# Patient Record
Sex: Female | Born: 1995 | Race: Black or African American | Hispanic: No | Marital: Single | Smoking: Current every day smoker
Health system: Southern US, Community
[De-identification: ages and names within clinical notes are randomized; demographics above are authoritative.]

---

## 2014-01-16 ENCOUNTER — Emergency Department (HOSPITAL_BASED_OUTPATIENT_CLINIC_OR_DEPARTMENT_OTHER): Payer: Self-pay

## 2014-01-16 ENCOUNTER — Encounter (HOSPITAL_BASED_OUTPATIENT_CLINIC_OR_DEPARTMENT_OTHER): Payer: Self-pay | Admitting: Emergency Medicine

## 2014-01-16 ENCOUNTER — Emergency Department (HOSPITAL_BASED_OUTPATIENT_CLINIC_OR_DEPARTMENT_OTHER)
Admission: EM | Admit: 2014-01-16 | Discharge: 2014-01-16 | Disposition: A | Discharge status: Home / Self Care | Payer: Self-pay | Attending: Emergency Medicine | Admitting: Emergency Medicine

## 2014-01-16 DIAGNOSIS — S93629A Sprain of tarsometatarsal ligament of unspecified foot, initial encounter: Secondary | ICD-10-CM | POA: Insufficient documentation

## 2014-01-16 DIAGNOSIS — X500XXA Overexertion from strenuous movement or load, initial encounter: Secondary | ICD-10-CM | POA: Insufficient documentation

## 2014-01-16 DIAGNOSIS — F17200 Nicotine dependence, unspecified, uncomplicated: Secondary | ICD-10-CM | POA: Insufficient documentation

## 2014-01-16 DIAGNOSIS — Y9302 Activity, running: Secondary | ICD-10-CM | POA: Insufficient documentation

## 2014-01-16 DIAGNOSIS — S93609A Unspecified sprain of unspecified foot, initial encounter: Secondary | ICD-10-CM

## 2014-01-16 DIAGNOSIS — Y9289 Other specified places as the place of occurrence of the external cause: Secondary | ICD-10-CM | POA: Insufficient documentation

## 2014-01-16 MED ORDER — NAPROXEN 500 MG PO TABS
500.0000 mg | ORAL_TABLET | Freq: Two times a day (BID) | ORAL | Status: DC
Start: 2014-01-16 — End: 2014-05-07

## 2014-01-16 NOTE — ED Notes (Signed)
Fell 1 month ago  Has started having left foot pain since getting worse

## 2014-01-16 NOTE — Discharge Instructions (Signed)
Foot Sprain The muscles and cord like structures which attach muscle to bone (tendons) that surround the feet are made up of units. A foot sprain can occur at the weakest spot in any of these units. This condition is most often caused by injury to or overuse of the foot, as from playing contact sports, or aggravating a previous injury, or from poor conditioning, or obesity. SYMPTOMS  Pain with movement of the foot.  Tenderness and swelling at the injury site.  Loss of strength is present in moderate or severe sprains. THE THREE GRADES OR SEVERITY OF FOOT SPRAIN ARE:  Mild (Grade I): Slightly pulled muscle without tearing of muscle or tendon fibers or loss of strength.  Moderate (Grade II): Tearing of fibers in a muscle, tendon, or at the attachment to bone, with small decrease in strength.  Severe (Grade III): Rupture of the muscle-tendon-bone attachment, with separation of fibers. Severe sprain requires surgical repair. Often repeating (chronic) sprains are caused by overuse. Sudden (acute) sprains are caused by direct injury or over-use. DIAGNOSIS  Diagnosis of this condition is usually by your own observation. If problems continue, a caregiver may be required for further evaluation and treatment. X-rays may be required to make sure there are not breaks in the bones (fractures) present. Continued problems may require physical therapy for treatment. PREVENTION  Use strength and conditioning exercises appropriate for your sport.  Warm up properly prior to working out.  Use athletic shoes that are made for the sport you are participating in.  Allow adequate time for healing. Early return to activities makes repeat injury more likely, and can lead to an unstable arthritic foot that can result in prolonged disability. Mild sprains generally heal in 3 to 10 days, with moderate and severe sprains taking 2 to 10 weeks. Your caregiver can help you determine the proper time required for  healing. HOME CARE INSTRUCTIONS   Apply ice to the injury for 15-20 minutes, 03-04 times per day. Put the ice in a plastic bag and place a towel between the bag of ice and your skin.  An elastic wrap (like an Ace bandage) may be used to keep swelling down.  Keep foot above the level of the heart, or at least raised on a footstool, when swelling and pain are present.  Try to avoid use other than gentle range of motion while the foot is painful. Do not resume use until instructed by your caregiver. Then begin use gradually, not increasing use to the point of pain. If pain does develop, decrease use and continue the above measures, gradually increasing activities that do not cause discomfort, until you gradually achieve normal use.  Use crutches if and as instructed, and for the length of time instructed.  Keep injured foot and ankle wrapped between treatments.  Massage foot and ankle for comfort and to keep swelling down. Massage from the toes up towards the knee.  Only take over-the-counter or prescription medicines for pain, discomfort, or fever as directed by your caregiver. SEEK IMMEDIATE MEDICAL CARE IF:   Your pain and swelling increase, or pain is not controlled with medications.  You have loss of feeling in your foot or your foot turns cold or blue.  You develop new, unexplained symptoms, or an increase of the symptoms that brought you to your caregiver. MAKE SURE YOU:   Understand these instructions.  Will watch your condition.  Will get help right away if you are not doing well or get worse. Document Released:   01/29/2002 Document Revised: 11/01/2011 Document Reviewed: 03/28/2008 [employer] Patient Information 2014 [employer].  

## 2014-01-16 NOTE — ED Notes (Signed)
C/o injury to left foot/ankle approx 1 month ago-wearing velcro spint

## 2014-01-16 NOTE — ED Provider Notes (Addendum)
CSN: [id number]     Arrival date & time 01/16/14  2007 History   First MD Initiated Contact with Patient 01/16/14 2151     This chart was scribed for Gwyneth Sprout, MD by Arlan Organ, ED Scribe. This patient was seen in room MH05/MH05 and the patient's care was started 10:01 PM.   Chief Complaint  Patient presents with  . Foot Injury   The history is provided by the patient. No language interpreter was used.    HPI Comments: Rhonda House is a 18 y.o. female who presents to the Emergency Department complaining of a constant, moderate L foot pain x 1 month that has progressively worsened in the last 5 days ago. [place] her foot in the water. She has tried OTC Ibuprofen with mild temporary improvement. She has also purchased a brace for her foot. She denies any fever, chills, numbness, paresthesia, loss of sensation, or weakness. Pt currently works at [employer] and states she is on her feet often. She has no pertinent past medical history. No other concerns this visit.  History reviewed. No pertinent past medical history. History reviewed. No pertinent past surgical history. No family history on file. History  Substance Use Topics  . Smoking status: Current Every Day Smoker  . Smokeless tobacco: Not on file  . Alcohol Use: No   OB History   Grav Para Term Preterm Abortions TAB SAB Ect Mult Living                 Review of Systems  A complete 10 system review of systems was obtained and all systems are negative except as noted in the HPI and PMH.    Allergies  Bactrim  Home Medications   Prior to Admission medications   Not on File   Triage Vitals: BP 169/92  Pulse 98  Temp(Src) 98.7 F (37.1 C) (Oral)  Resp 18  Ht 5\' 7"  (1.702 m)  Wt 210 lb (95.255 kg)  BMI 32.88 kg/m2  SpO2 99%  LMP 01/13/2014   Physical Exam  Nursing note and vitals reviewed. Constitutional: She is oriented to person, place, and  time. She appears well-developed and well-nourished.  HENT:  Head: Normocephalic.  Eyes: EOM are normal.  Neck: Normal range of motion.  Pulmonary/Chest: Effort normal.  Abdominal: She exhibits no distension.  Musculoskeletal: Normal range of motion. She exhibits tenderness.  Pain over L proximal first metatarsal and talar No distal fibular pain Pain with foot extension No medial or lateral malleolar tenderness  Neurological: She is alert and oriented to person, place, and time.  Psychiatric: She has a normal mood and affect.    ED Course  Procedures (including critical care time)  DIAGNOSTIC STUDIES: Oxygen Saturation is 99% on RA, Normal by my interpretation.    COORDINATION OF CARE: 10:04 PM- Will order X-Rays. Discussed treatment plan with pt at bedside and pt agreed to plan.     Labs Review Labs Reviewed - No data to display  Imaging Review Dg Ankle Complete Left  01/16/2014   CLINICAL DATA:  Injury to the left foot, with left ankle pain.  EXAM: LEFT ANKLE COMPLETE - 3+ VIEW  COMPARISON:  None.  FINDINGS: There is no evidence of fracture or dislocation. The ankle mortise is intact; the interosseous space is within normal limits. No talar tilt or subluxation is seen. A small osseous fragment distal to the distal fibula is thought  to reflect an os subfibulare.  The joint spaces are preserved. No significant soft tissue abnormalities are seen.  IMPRESSION: 1. No definite evidence of fracture or dislocation. 2. Apparent loss subfibulare noted; if the patient has symptoms at the distal fibula, this could reflect a small avulsion fracture.   Electronically Signed   By: Roanna RaiderJeffery  Chang M.D.   On: 01/16/2014 21:39   Dg Foot Complete Left  01/16/2014   CLINICAL DATA:  Larey SeatFell 1 month ago, foot and ankle pain progressively worse, foot injury  EXAM: LEFT FOOT - COMPLETE 3+ VIEW  COMPARISON:  None.  FINDINGS: Osseous mineralization normal.  Joint spaces preserved.  No fracture, dislocation, or  bone destruction.  IMPRESSION: Normal exam.   Electronically Signed   By: Ulyses SouthwardMark  Boles M.D.   On: 01/16/2014 21:38     EKG Interpretation None      MDM   Final diagnoses:  Foot sprain    Patient with foot injury that worsened after wearing heels. She had the initial injury approximately one month ago and has been sore. She has pain Over the medial dorsal surface of the left foot that is worse with flexion and extension. Neurovascularly intact and imaging negative. She has no distal fibula pain. He'll most likely foot sprain versus ligamentous injury. She was treated with anti-inflammatories, ice and elevation. She will followup with sports medicine if symptoms are not improving  I personally performed the services described in this documentation, which was scribed in my presence. The recorded information has been reviewed and is accurate.    [name], MD 01/16/14 2240  [name], MD 01/16/14 2250

## 2014-05-07 ENCOUNTER — Encounter (HOSPITAL_BASED_OUTPATIENT_CLINIC_OR_DEPARTMENT_OTHER): Payer: Self-pay | Admitting: Emergency Medicine

## 2014-05-07 ENCOUNTER — Emergency Department (HOSPITAL_BASED_OUTPATIENT_CLINIC_OR_DEPARTMENT_OTHER): Payer: Self-pay

## 2014-05-07 ENCOUNTER — Emergency Department (HOSPITAL_BASED_OUTPATIENT_CLINIC_OR_DEPARTMENT_OTHER)
Admission: EM | Admit: 2014-05-07 | Discharge: 2014-05-07 | Disposition: A | Discharge status: Home / Self Care | Payer: Self-pay | Attending: Emergency Medicine | Admitting: Emergency Medicine

## 2014-05-07 DIAGNOSIS — Z3202 Encounter for pregnancy test, result negative: Secondary | ICD-10-CM | POA: Insufficient documentation

## 2014-05-07 DIAGNOSIS — F17200 Nicotine dependence, unspecified, uncomplicated: Secondary | ICD-10-CM | POA: Insufficient documentation

## 2014-05-07 DIAGNOSIS — B9789 Other viral agents as the cause of diseases classified elsewhere: Secondary | ICD-10-CM

## 2014-05-07 DIAGNOSIS — J069 Acute upper respiratory infection, unspecified: Secondary | ICD-10-CM | POA: Insufficient documentation

## 2014-05-07 DIAGNOSIS — R05 Cough: Secondary | ICD-10-CM | POA: Insufficient documentation

## 2014-05-07 DIAGNOSIS — R059 Cough, unspecified: Secondary | ICD-10-CM | POA: Insufficient documentation

## 2014-05-07 LAB — URINALYSIS, ROUTINE W REFLEX MICROSCOPIC
Bilirubin Urine: NEGATIVE
Glucose, UA: NEGATIVE mg/dL
Hgb urine dipstick: NEGATIVE
KETONES UR: NEGATIVE mg/dL
LEUKOCYTES UA: NEGATIVE
NITRITE: NEGATIVE
PROTEIN: NEGATIVE mg/dL
Specific Gravity, Urine: 1.009 (ref 1.005–1.030)
Urobilinogen, UA: 0.2 mg/dL (ref 0.0–1.0)
pH: 6.5 (ref 5.0–8.0)

## 2014-05-07 LAB — PREGNANCY, URINE: Preg Test, Ur: NEGATIVE

## 2014-05-07 LAB — RAPID STREP SCREEN (MED CTR MEBANE ONLY): Streptococcus, Group A Screen (Direct): NEGATIVE

## 2014-05-07 MED ORDER — ALBUTEROL SULFATE HFA 108 (90 BASE) MCG/ACT IN AERS
2.0000 | INHALATION_SPRAY | RESPIRATORY_TRACT | Status: DC | PRN
  Administered 2014-05-07: 2 via RESPIRATORY_TRACT
  Filled 2014-05-07: qty 6.7

## 2014-05-07 MED ORDER — OXYMETAZOLINE HCL 0.05 % NA SOLN
1.0000 | Freq: Once | NASAL | Status: AC
  Administered 2014-05-07: 1 via NASAL
  Filled 2014-05-07: qty 15

## 2014-05-07 NOTE — ED Provider Notes (Signed)
CSN: [id number]     Arrival date & time 05/07/14  1956 History  This chart was scribed for Ethelda Chick, MD by Roxy Cedar, ED Scribe. This patient was seen in room MH11/MH11 and the patient's care was started at 10:29 PM.   Chief Complaint  Patient presents with  . Cough   Patient is a 18 y.o. female presenting with pharyngitis and cough. The history is provided by the patient. No language interpreter was used.  Sore Throat This is a new problem. The current episode started more than 2 days ago. The problem occurs constantly. The problem has not changed since onset.Pertinent negatives include no chest pain, no abdominal pain, no headaches and no shortness of breath. Nothing aggravates the symptoms. Nothing relieves the symptoms. She has tried nothing for the symptoms.  Cough Severity:  Moderate Onset quality:  Gradual Duration:  3 days Timing:  Intermittent Progression:  Unchanged Chronicity:  New Associated symptoms: chills and sore throat   Associated symptoms: no chest pain, no headaches and no shortness of breath     HPI Comments: Rhonda House is a 18 y.o. female who presents to the Emergency Department complaining of chills, sore throat, cough that began 3 days ago. Patient assumed it was related to seasonal allergies and has tried taking Claritin, Theraflu, and Robitussin with no relief. Patient states she developed a blister in her mouth in the past 2 days.   History reviewed. No pertinent past medical history. History reviewed. No pertinent past surgical history. No family history on file. History  Substance Use Topics  . Smoking status: Current Every Day Smoker  . Smokeless tobacco: Not on file  . Alcohol Use: No   OB History   Grav Para Term Preterm Abortions TAB SAB Ect Mult Living                 Review of Systems  Constitutional: Positive for chills.  HENT: Positive for sore throat.   Respiratory: Positive for cough. Negative for shortness of breath.    Cardiovascular: Negative for chest pain.  Gastrointestinal: Negative for abdominal pain.  Neurological: Negative for headaches.  All other systems reviewed and are negative.  Allergies  Bactrim  Home Medications   Prior to Admission medications   Not on File   Triage Vitals: BP 133/64  Pulse 82  Temp(Src) 98.9 F (37.2 C) (Oral)  Resp 18  Ht  (1.727 m)  Wt 210 lb (95.255 kg)  BMI 31.94 kg/m2  SpO2 100%  LMP 04/21/2014  Physical Exam  Nursing note and vitals reviewed. Constitutional: She is oriented to person, place, and time. She appears well-developed and well-nourished. No distress.  HENT:  Head: Normocephalic and atraumatic.  Right Ear: External ear normal.  Left Ear: External ear normal.  Mouth/Throat: Oropharynx is clear and moist. No oropharyngeal exudate.  Copious nasal congestion. Mild erythema of oropharynx. Palate is symmetric. Uvula is midline.  Eyes: Conjunctivae and EOM are normal.  Neck: Neck supple. No tracheal deviation present.  Cardiovascular: Normal rate.   Pulmonary/Chest: Effort normal and breath sounds normal. No respiratory distress. She has no wheezes.  Lungs are clear. No wheezing or rhonchi noted.    Musculoskeletal: Normal range of motion.  Neurological: She is alert and oriented to person, place, and time.  Skin: Skin is warm and dry.  Psychiatric: She has a normal mood and affect. Her behavior is normal.    ED Course  Procedures (including critical care time)  DIAGNOSTIC STUDIES: Oxygen  Saturation is 100% on RA, normal by my interpretation.    COORDINATION OF CARE: 10:35 PM- Discussed plans to give patient an inhaler and Pt advised of plan for treatment and pt agrees.  Labs Review Labs Reviewed  RAPID STREP SCREEN  CULTURE, GROUP A STREP  URINALYSIS, ROUTINE W REFLEX MICROSCOPIC  PREGNANCY, URINE    Imaging Review Dg Chest 2 View  05/07/2014   CLINICAL DATA:  Cough and sore throat.  Chest pain.  EXAM: CHEST  2 VIEW   COMPARISON:  None.  FINDINGS: Normal heart size. Clear lungs. No pneumothorax. No pleural effusion.  IMPRESSION: No active cardiopulmonary disease.   Electronically Signed   By: Maryclare Bean M.D.   On: 05/07/2014 21:31     EKG Interpretation None     MDM   Final diagnoses:  Viral URI with cough    Pt presenting with cough, nasal congestion sore throat for the past several days.  Rapid strep negative, cxr reassuring.  Pt treated with afrin and given albuterol inhaler to help with symptomatic care.  Throat culture pending.  Suspect viral URI given constellation of symptoms.  Discharged with strict return precautions.  Pt agreeable with plan.  Nursing notes including past medical history and social history reviewed and considered in documentation   Xray images reviewed and interpreted by me as well.    I personally performed the services described in this documentation, which was scribed in my presence. The recorded information has been reviewed and is accurate.  Ethelda Chick, MD 05/07/14 [phone]

## 2014-05-07 NOTE — Discharge Instructions (Signed)
Return to the ED with any concerns including difficulty breathing despite using albuterol every 4 hours, not drinking fluids, decreased urine output, vomiting and not able to keep down liquids or medications, decreased level of alertness/lethargy, or any other alarming symptoms  You should use the afrin 2 sprays in each nostril twice daily, do not use for more than 3 days as this may cause worsening of your nasal congestion

## 2014-05-07 NOTE — Patient Instructions (Signed)
Instructed patient on the proper use of administering albuterol mdi via aerochamber patient tolerated well 

## 2014-05-07 NOTE — ED Notes (Signed)
Onset x 3 days   sorethroat  Cough  Blister in mouth

## 2014-05-08 ENCOUNTER — Encounter (HOSPITAL_COMMUNITY): Payer: Self-pay | Admitting: Emergency Medicine

## 2014-05-08 ENCOUNTER — Emergency Department (HOSPITAL_COMMUNITY)
Admission: EM | Admit: 2014-05-08 | Discharge: 2014-05-08 | Disposition: A | Discharge status: Home / Self Care | Payer: Self-pay | Attending: Emergency Medicine | Admitting: Emergency Medicine

## 2014-05-08 DIAGNOSIS — IMO0001 Reserved for inherently not codable concepts without codable children: Secondary | ICD-10-CM | POA: Insufficient documentation

## 2014-05-08 DIAGNOSIS — J029 Acute pharyngitis, unspecified: Secondary | ICD-10-CM | POA: Insufficient documentation

## 2014-05-08 DIAGNOSIS — H5789 Other specified disorders of eye and adnexa: Secondary | ICD-10-CM | POA: Insufficient documentation

## 2014-05-08 DIAGNOSIS — H6993 Unspecified Eustachian tube disorder, bilateral: Secondary | ICD-10-CM

## 2014-05-08 DIAGNOSIS — K120 Recurrent oral aphthae: Secondary | ICD-10-CM

## 2014-05-08 DIAGNOSIS — F17200 Nicotine dependence, unspecified, uncomplicated: Secondary | ICD-10-CM | POA: Insufficient documentation

## 2014-05-08 DIAGNOSIS — J22 Unspecified acute lower respiratory infection: Secondary | ICD-10-CM

## 2014-05-08 DIAGNOSIS — J3489 Other specified disorders of nose and nasal sinuses: Secondary | ICD-10-CM

## 2014-05-08 DIAGNOSIS — H6990 Unspecified Eustachian tube disorder, unspecified ear: Secondary | ICD-10-CM | POA: Insufficient documentation

## 2014-05-08 DIAGNOSIS — J069 Acute upper respiratory infection, unspecified: Secondary | ICD-10-CM

## 2014-05-08 DIAGNOSIS — H6980 Other specified disorders of Eustachian tube, unspecified ear: Secondary | ICD-10-CM | POA: Insufficient documentation

## 2014-05-08 MED ORDER — GUAIFENESIN ER 600 MG PO TB12: 1200.0000 mg | ORAL_TABLET | Freq: Two times a day (BID) | ORAL | Status: DC

## 2014-05-08 MED ORDER — HYDROCODONE-HOMATROPINE 5-1.5 MG/5ML PO SYRP: 5.0000 mL | ORAL_SOLUTION | Freq: Four times a day (QID) | ORAL | Status: DC | PRN

## 2014-05-08 NOTE — ED Provider Notes (Signed)
CSN: [id number]     Arrival date & time 05/08/14  1212 History  This chart was scribed for non-physician practitioner, Arthor Captain, PA-C working with Flint Melter, MD by Greggory Stallion, ED scribe. This patient was seen in room WTR8/WTR8 and the patient's care was started at 1:28 PM.   Chief Complaint  Patient presents with  . Sore Throat   The history is provided by the patient. No language interpreter was used.   HPI Comments: Rhonda House is a 18 y.o. female who presents to the Emergency Department complaining of gradual onset sore throat that started 5 days ago and worsened 3 days ago. States she woke up with nasal congestion, sinus pressure, chest congestion and nonproductive cough 2 days ago. She was evaluated at an urgent care yesterday where chest xray and rapid strep were done. Results were negative so she was discharged home with a nasal spray and an inhaler. States medications have provided little relief. Pt has taken Claritin, Theraflu and OTC cough syrup with no relief. She woke up this morning with bilateral sharp ear pain, generalized body aches and eye redness. Also reports a blister to the roof of her mouth. Pt's friend had a sinus infection right before her symptoms started. Denies fever.   History reviewed. No pertinent past medical history. History reviewed. No pertinent past surgical history. History reviewed. No pertinent family history. History  Substance Use Topics  . Smoking status: Current Every Day Smoker  . Smokeless tobacco: Not on file  . Alcohol Use: No   OB History   Grav Para Term Preterm Abortions TAB SAB Ect Mult Living                 Review of Systems  Constitutional: Negative for fever.  HENT: Positive for congestion, ear pain, mouth sores, sinus pressure and sore throat.   Eyes: Positive for redness.  Respiratory: Positive for cough.   Cardiovascular: Negative for chest pain.  Gastrointestinal: Negative for abdominal distention.   Musculoskeletal: Positive for myalgias.  Skin: Negative for rash.  Neurological: Negative for speech difficulty.  Psychiatric/Behavioral: Negative for confusion.   Allergies  Bactrim  Home Medications   Prior to Admission medications   Medication Sig Start Date End Date Taking? Authorizing Provider  guaiFENesin (MUCINEX) 600 MG 12 hr tablet Take 2 tablets (1,200 mg total) by mouth 2 (two) times daily. 05/08/14   Lavone Barrientes, PA-C  HYDROcodone-homatropine (HYCODAN) 5-1.5 MG/5ML syrup Take 5 mLs by mouth every 6 (six) hours as needed for cough. 05/08/14   Darroll Bredeson, PA-C   BP 130/60  Pulse 80  Temp(Src) 98.4 F (36.9 C) (Oral)  Resp 18  SpO2 100%  LMP 04/21/2014  Physical Exam  Nursing note and vitals reviewed. Constitutional: She is oriented to person, place, and time. She appears well-developed and well-nourished. No distress.  HENT:  Head: Normocephalic and atraumatic.  Right Ear: Tympanic membrane, external ear and ear canal normal.  Left Ear: Tympanic membrane, external ear and ear canal normal.  Nose: Mucosal edema and rhinorrhea present. No epistaxis. Right sinus exhibits no maxillary sinus tenderness and no frontal sinus tenderness. Left sinus exhibits no maxillary sinus tenderness and no frontal sinus tenderness.  Mouth/Throat: Uvula is midline and mucous membranes are normal. Mucous membranes are not pale and not cyanotic. No oropharyngeal exudate, posterior oropharyngeal edema, posterior oropharyngeal erythema or tonsillar abscesses.  Aphthous ulcer to roof of mouth.   Eyes: Conjunctivae are normal. Pupils are equal, round, and reactive to light.  Neck: Normal range of motion and full passive range of motion without pain.  Cardiovascular: Normal rate and regular rhythm.   Pulmonary/Chest: Effort normal and breath sounds normal. No stridor. No respiratory distress. She has no wheezes. She has no rhonchi. She has no rales.  Clear and equal breath sounds without  focal wheezes, rhonchi, rales  Abdominal: Soft. Bowel sounds are normal. There is no tenderness.  Musculoskeletal: Normal range of motion.  Lymphadenopathy:    She has cervical adenopathy.  Neurological: She is alert and oriented to person, place, and time.  Skin: Skin is warm and dry. No rash noted. She is not diaphoretic.  Psychiatric: She has a normal mood and affect.    ED Course  Procedures (including critical care time)  DIAGNOSTIC STUDIES: Oxygen Saturation is 100% on RA, normal by my interpretation.    COORDINATION OF CARE: 1:37 PM-Advised pt that sinus infections are not treated until 14 days of symptoms and that a CT does not need to be done. Discussed treatment plan which includes symptomatic treatment and high dose mucinex with pt at bedside and pt agreed to plan. If symptoms persist in 2 weeks, advised pt to follow up.   Labs Review Labs Reviewed - No data to display  Imaging Review No results found.   EKG Interpretation None      MDM   Final diagnoses:  Chest cold  URI (upper respiratory infection)  Sinus pressure  Eustachian tube disorder, bilateral  Aphthous ulcer of mouth     Patients symptoms are consistent with URI, likely viral etiology. Discussed that antibiotics are not indicated for viral infections. Pt will be discharged with symptomatic treatment.  Verbalizes understanding and is agreeable with plan. Pt is hemodynamically stable & in NAD prior to dc.  I personally performed the services described in this documentation, which was scribed in my presence. The recorded information has been reviewed and is accurate.  Arthor Captain, PA-C 05/09/14 2128

## 2014-05-08 NOTE — Progress Notes (Signed)
[facility] Community Liaison Stacy,  ° °Provided pt with a list of self-pay providers to help patient establish a pcp.  °

## 2014-05-08 NOTE — Discharge Instructions (Signed)
Read the instructions below on reasons to return to the emergency department and to learn more about your diagnosis.  Use over the counter medications for symptomatic relief as we discussed (musinex as a decongestant, Tylenol for fever/pain, Motrin/Ibuprofen for muscle aches). If prescribed a cough suppressant during your visit, do not operate heavy machinery with in 5 hours of taking this medication. Followup with your primary care doctor in 4 days if your symptoms persist.  Your more than welcome to return to the emergency department if symptoms worsen or become concerning.  Upper Respiratory Infection, Adult  An upper respiratory infection (URI) is also sometimes known as the common cold. Most people improve within 1 week, but symptoms can last up to 2 weeks. A residual cough may last even longer.   URI is most commonly caused by a virus. Viruses are NOT treated with antibiotics. You can easily spread the virus to others by oral contact. This includes kissing, sharing a glass, coughing, or sneezing. Touching your mouth or nose and then touching a surface, which is then touched by another person, can also spread the virus.   TREATMENT  Treatment is directed at relieving symptoms. There is no cure. Antibiotics are not effective, because the infection is caused by a virus, not by bacteria. Treatment may include:  Increased fluid intake. Sports drinks offer valuable electrolytes, sugars, and fluids.  Breathing heated mist or steam (vaporizer or shower).  Eating chicken soup or other clear broths, and maintaining good nutrition.  Getting plenty of rest.  Using gargles or lozenges for comfort.  Controlling fevers with ibuprofen or acetaminophen as directed by your caregiver.  Increasing usage of your inhaler if you have asthma.  Return to work when your temperature has returned to normal.   SEEK MEDICAL CARE IF:  After the first few days, you feel you are getting worse rather than better.  You  develop worsening shortness of breath, or brown or red sputum. These may be signs of pneumonia.  You develop yellow or brown nasal discharge or pain in the face, especially when you bend forward. These may be signs of sinusitis.  You develop a fever, swollen neck glands, pain with swallowing, or white areas in the back of your throat. These may be signs of strep throat.   Oral Ulcers Oral ulcers are painful, shallow sores around the lining of the mouth. They can affect the gums, the inside of the lips, and the cheeks. (Sores on the outside of the lips and on the face are different.) They typically first occur in school-aged children and teenagers. Oral ulcers may also be called canker sores or cold sores. CAUSES  Canker sores and cold sores can be caused by many factors including: Infection. Injury. Sun exposure. Medications. Emotional stress. Food allergies. Vitamin deficiencies. Toothpastes containing sodium lauryl sulfate. The herpes virus can be the cause of mouth ulcers. The first infection can be severe and cause 10 or more ulcers on the gums, tongue, and lips with fever and difficulty in swallowing. This infection usually occurs between the ages of 1 and 3 years.  SYMPTOMS  The typical sore is about  inch (6 mm) in size and is an oval or round ulcer with red borders. DIAGNOSIS  Your caregiver can diagnose simple oral ulcers by examination. Additional testing is usually not required.  TREATMENT  Treatment is aimed at pain relief. Generally, oral ulcers resolve by themselves within 1 to 2 weeks without medication and are not contagious unless caused by  herpes (and other viruses). Antibiotics are not effective with mouth sores. Avoid direct contact with others until the ulcer is completely healed. See your caregiver for follow-up care as recommended. Also: Offer a soft diet. Encourage plenty of fluids to prevent dehydration. Popsicles and milk shakes can be helpful. Avoid acidic and  salty foods and drinks such as orange juice. Infants and young children will often refuse to drink because of pain. Using a teaspoon, cup, or syringe to give small amounts of fluids frequently can help prevent dehydration. Cold compresses on the face may help reduce pain. Pain medication can help control soreness. A solution of diphenhydramine mixed with a liquid antacid can be useful to decrease the soreness of ulcers. Consult a caregiver for the dosing. Liquids or ointments with a numbing ingredient may be helpful when used as recommended. Older children and teenagers can rinse their mouth with a salt-water mixture (1/2 teaspoon of salt in 8 ounces of water) four times a day. This treatment is uncomfortable but may reduce the time the ulcers are present. There are many over-the-counter throat lozenges and medications available for oral ulcers. Their effectiveness has not been studied. Consult your medical caregiver prior to using homeopathic treatments for oral ulcers. SEEK MEDICAL CARE IF:  You think your child needs to be seen. The pain worsens and you cannot control it. There are 4 or more ulcers. The lips and gums begin to bleed and crust. A single mouth ulcer is near a tooth that is causing a toothache or pain. Your child has a fever, swollen face, or swollen glands. The ulcers began after starting a medication. Mouth ulcers keep reoccurring or last more than 2 weeks. You think your child is not taking adequate fluids. SEEK IMMEDIATE MEDICAL CARE IF:  Your child has a high fever. Your child is unable to swallow or becomes dehydrated. Your child looks or acts very ill. An ulcer caused by a chemical your child accidentally put in their mouth. Document Released: 09/16/2004 Document Revised: 12/24/2013 Document Reviewed: 05/01/2009 [facility] Patient Information 2015 [place], [place]. This information is not intended to replace advice given to you by your health care provider. Make sure you  discuss any questions you have with your health care provider.  Viral Infections A viral infection can be caused by different types of viruses.Most viral infections are not serious and resolve on their own. However, some infections may cause severe symptoms and may lead to further complications. SYMPTOMS Viruses can frequently cause: Minor sore throat. Aches and pains. Headaches. Runny nose. Different types of rashes. Watery eyes. Tiredness. Cough. Loss of appetite. Gastrointestinal infections, resulting in nausea, vomiting, and diarrhea. These symptoms do not respond to antibiotics because the infection is not caused by bacteria. However, you might catch a bacterial infection following the viral infection. This is sometimes called a "superinfection." Symptoms of such a bacterial infection may include: Worsening sore throat with pus and difficulty swallowing. Swollen neck glands. Chills and a high or persistent fever. Severe headache. Tenderness over the sinuses. Persistent overall ill feeling (malaise), muscle aches, and tiredness (fatigue). Persistent cough. Yellow, green, or brown mucus production with coughing. HOME CARE INSTRUCTIONS  Only take over-the-counter or prescription medicines for pain, discomfort, diarrhea, or fever as directed by your caregiver. Drink enough water and fluids to keep your urine clear or pale yellow. Sports drinks can provide valuable electrolytes, sugars, and hydration. Get plenty of rest and maintain proper nutrition. Soups and broths with crackers or rice are fine. SEEK IMMEDIATE  MEDICAL CARE IF:  You have severe headaches, shortness of breath, chest pain, neck pain, or an unusual rash. You have uncontrolled vomiting, diarrhea, or you are unable to keep down fluids. You or your child has an oral temperature above 102 F (38.9 C), not controlled by medicine. Your baby is older than 3 months with a rectal temperature of 102 F (38.9 C) or  higher. Your baby is 74 months old or younger with a rectal temperature of 100.4 F (38 C) or higher. MAKE SURE YOU:  Understand these instructions. Will watch your condition. Will get help right away if you are not doing well or get worse. Document Released: 05/19/2005 Document Revised: 11/01/2011 Document Reviewed: 12/14/2010 [facility] Patient Information 2015 [place], [place]. This information is not intended to replace advice given to you by your health care provider. Make sure you discuss any questions you have with your health care provider.

## 2014-05-08 NOTE — ED Notes (Addendum)
Pt states sore throat since Thursday.  Pt is a smoker and didn't think anything of it.  Sunday.  Pain increased.  No fever.  Pt also states nasal congestion.  Pt had to go home yesterday d/t increased symptoms.  Pt went to [facility].  Told URI.  Pt had c/o blister in her mouth and states no one checked it.  Was given inhaler and feels it has made her worse.  Strep was negative yesterday.  CXR completed also

## 2014-05-09 LAB — CULTURE, GROUP A STREP

## 2014-05-10 NOTE — ED Provider Notes (Signed)
Medical screening examination/treatment/procedure(s) were performed by non-physician practitioner and as supervising physician I was immediately available for consultation/collaboration.  Flint Melter, MD 05/10/14 [phone]

## 2014-08-09 ENCOUNTER — Emergency Department (HOSPITAL_COMMUNITY)
Admission: EM | Admit: 2014-08-09 | Discharge: 2014-08-09 | Disposition: A | Discharge status: Home / Self Care | Payer: Medicaid Other | Attending: Emergency Medicine | Admitting: Emergency Medicine

## 2014-08-09 ENCOUNTER — Encounter (HOSPITAL_COMMUNITY): Payer: Self-pay | Admitting: *Deleted

## 2014-08-09 DIAGNOSIS — Z79899 Other long term (current) drug therapy: Secondary | ICD-10-CM | POA: Insufficient documentation

## 2014-08-09 DIAGNOSIS — M542 Cervicalgia: Secondary | ICD-10-CM | POA: Diagnosis present

## 2014-08-09 DIAGNOSIS — Z720 Tobacco use: Secondary | ICD-10-CM | POA: Diagnosis not present

## 2014-08-09 DIAGNOSIS — M436 Torticollis: Secondary | ICD-10-CM | POA: Diagnosis not present

## 2014-08-09 MED ORDER — IBUPROFEN 800 MG PO TABS: 800.0000 mg | ORAL_TABLET | Freq: Three times a day (TID) | ORAL | Status: DC

## 2014-08-09 MED ORDER — METHOCARBAMOL 500 MG PO TABS: 500.0000 mg | ORAL_TABLET | Freq: Two times a day (BID) | ORAL | Status: DC

## 2014-08-09 NOTE — ED Notes (Signed)
Pt reports on Tuesday she was lifting heavy bags, started having back pain on Thursday. Now pain from left side of neck and back. Pain

## 2014-08-09 NOTE — ED Provider Notes (Signed)
CSN: [id number]     Arrival date & time 08/09/14  1220 History  This chart was scribed for Rhonda House Deaja Rizo, PA-C, working with Elwin MochaBlair Walden, MD found by Elon SpannerGarrett Cook, ED Scribe. This patient was seen in room WTR9/WTR9 and the patient's care was started at 1:06 PM.   Chief Complaint  Patient presents with  . Neck Pain  . Back Pain   The history is provided by the patient. No language interpreter was used.   HPI Comments: Rhonda NationsKellie House is a 18 y.o. female who presents to the Emergency Department complaining of worsening upper back and chest pain onset yesterday with worsening on inspiration and current spreading down her entire left side.  She also complains of a left worsening left temporal headache rated 7-8/10.  Patient reports she lifted a heavy load of laundry over her head 3 days ago, but denies pain onset immediately after.  Patient reports SOB and cough normal to baseline.  Patient is current smoker.  Patient denies use of birth control pills, recent surgery, prolonged periods of immobilization. No prior hx of PE/DVT. Patient denies being pregnant.  Patient denies hemoptysis, vision changes, n/v/d, fever, chills, abdominal pain.    History reviewed. No pertinent past medical history. History reviewed. No pertinent past surgical history. History reviewed. No pertinent family history. History  Substance Use Topics  . Smoking status: Current Every Day Smoker -- 0.50 packs/day for 3 years    Types: Cigarettes  . Smokeless tobacco: Not on file  . Alcohol Use: No   OB History    No data available     Review of Systems  Eyes: Negative for visual disturbance.  Respiratory: Positive for cough and shortness of breath.   Gastrointestinal: Negative for nausea, vomiting and diarrhea.  All other systems reviewed and are negative.     Allergies  Bactrim  Home Medications   Prior to Admission medications   Medication Sig Start Date End Date Taking? Authorizing Provider  guaiFENesin  (MUCINEX) 600 MG 12 hr tablet Take 2 tablets (1,200 mg total) by mouth 2 (two) times daily. 05/08/14   Abigail Harris, PA-C  HYDROcodone-homatropine (HYCODAN) 5-1.5 MG/5ML syrup Take 5 mLs by mouth every 6 (six) hours as needed for cough. 05/08/14   Abigail Harris, PA-C   BP 136/88 mmHg  Pulse 81  Temp(Src) 98.9 F (37.2 C) (Oral)  Resp 16  SpO2 97%  LMP 07/22/2014 Physical Exam  Constitutional: She is oriented to person, place, and time. She appears well-developed and well-nourished. No distress.  HENT:  Head: Normocephalic and atraumatic.  Mouth/Throat: Oropharynx is clear and moist.  Tenderness to left temporal region on palpation but no bruits or overlying skin changes.   Eyes: Conjunctivae and EOM are normal.  Neck: Neck supple. No tracheal deviation present.  No nuchal rigigity  Cardiovascular: Normal rate.   Pulmonary/Chest: Breath sounds normal. No respiratory distress. She has no wheezes. She has no rales.  Musculoskeletal: Normal range of motion. She exhibits tenderness.  Paracervical spina muscle tenderness.  Tenderness along bilateral trapezius muscle without any significant midline spine tenderess.  No nuchal rigidity.  Increasing pain with neck rotation.  Normal grip strength with normal strength to upper extremities.  No overlying skin changes.   Neurological: She is alert and oriented to person, place, and time.  Skin: Skin is warm and dry.  Psychiatric: She has a normal mood and affect. Her behavior is normal.  Nursing note and vitals reviewed.   ED Course  Procedures (including critical  care time)  DIAGNOSTIC STUDIES: Oxygen Saturation is 97% on RA, normal by my interpretation.    COORDINATION OF CARE:  2:11 PM Suspect torticollis.  Doubt mengingitis or other acute emergent medical condition.  Doubt PE.  Advised patient of return precautions including fever, SOB, hemoptysis.  WIll prescribe muscle relaxant and pain medication.  Patient acknowledges and agrees with  plan.     Labs Review Labs Reviewed - No data to display  Imaging Review No results found.   EKG Interpretation None      MDM   Final diagnoses:  Torticollis, acute    BP 136/88 mmHg  Pulse 81  Temp(Src) 98.9 F (37.2 C) (Oral)  Resp 16  SpO2 97%  LMP 07/22/2014   I personally performed the services described in this documentation, which was scribed in my presence. The recorded information has been reviewed and is accurate.     Rhonda House Esiah Bazinet, PA-C 08/09/14 1418  Elwin MochaBlair Walden, MD 08/09/14 [phone]

## 2014-08-09 NOTE — Discharge Instructions (Signed)

## 2014-09-20 ENCOUNTER — Encounter (HOSPITAL_BASED_OUTPATIENT_CLINIC_OR_DEPARTMENT_OTHER): Payer: Self-pay

## 2014-09-20 ENCOUNTER — Emergency Department (HOSPITAL_BASED_OUTPATIENT_CLINIC_OR_DEPARTMENT_OTHER): Payer: Medicaid Other

## 2014-09-20 ENCOUNTER — Emergency Department (HOSPITAL_BASED_OUTPATIENT_CLINIC_OR_DEPARTMENT_OTHER)
Admission: EM | Admit: 2014-09-20 | Discharge: 2014-09-20 | Disposition: A | Discharge status: Home / Self Care | Payer: Medicaid Other | Attending: Emergency Medicine | Admitting: Emergency Medicine

## 2014-09-20 DIAGNOSIS — Z79899 Other long term (current) drug therapy: Secondary | ICD-10-CM | POA: Diagnosis not present

## 2014-09-20 DIAGNOSIS — J069 Acute upper respiratory infection, unspecified: Secondary | ICD-10-CM | POA: Diagnosis not present

## 2014-09-20 DIAGNOSIS — Z720 Tobacco use: Secondary | ICD-10-CM | POA: Diagnosis not present

## 2014-09-20 DIAGNOSIS — R05 Cough: Secondary | ICD-10-CM

## 2014-09-20 DIAGNOSIS — E669 Obesity, unspecified: Secondary | ICD-10-CM | POA: Diagnosis not present

## 2014-09-20 DIAGNOSIS — M940 Chondrocostal junction syndrome [Tietze]: Secondary | ICD-10-CM | POA: Diagnosis not present

## 2014-09-20 DIAGNOSIS — H9209 Otalgia, unspecified ear: Secondary | ICD-10-CM | POA: Diagnosis not present

## 2014-09-20 DIAGNOSIS — R059 Cough, unspecified: Secondary | ICD-10-CM

## 2014-09-20 MED ORDER — PROMETHAZINE-DM 6.25-15 MG/5ML PO SYRP: 5.0000 mL | ORAL_SOLUTION | Freq: Four times a day (QID) | ORAL | Status: DC | PRN

## 2014-09-20 MED ORDER — IBUPROFEN 800 MG PO TABS
800.0000 mg | ORAL_TABLET | Freq: Once | ORAL | Status: AC
  Administered 2014-09-20: 800 mg via ORAL
  Filled 2014-09-20: qty 1

## 2014-09-20 MED ORDER — IBUPROFEN 800 MG PO TABS: 800.0000 mg | ORAL_TABLET | Freq: Three times a day (TID) | ORAL | Status: DC

## 2014-09-20 MED ORDER — ALBUTEROL SULFATE HFA 108 (90 BASE) MCG/ACT IN AERS: 1.0000 | INHALATION_SPRAY | RESPIRATORY_TRACT | Status: DC | PRN

## 2014-09-20 MED ORDER — HYDROCOD POLST-CHLORPHEN POLST 10-8 MG/5ML PO LQCR: 5.0000 mL | Freq: Two times a day (BID) | ORAL | Status: DC | PRN

## 2014-09-20 MED ORDER — ALBUTEROL SULFATE HFA 108 (90 BASE) MCG/ACT IN AERS
2.0000 | INHALATION_SPRAY | Freq: Once | RESPIRATORY_TRACT | Status: AC
  Administered 2014-09-20: 2 via RESPIRATORY_TRACT
  Filled 2014-09-20: qty 6.7

## 2014-09-20 NOTE — Discharge Instructions (Signed)
Take Tussionex for severe cough only. No driving or operating heavy machinery while taking Tussionex. This medication may cause drowsiness. Use albuterol inhaler every 4-6 hours as needed for cough. Take ibuprofen as directed.  Costochondritis Costochondritis, sometimes called Tietze syndrome, is a swelling and irritation (inflammation) of the tissue (cartilage) that connects your ribs with your breastbone (sternum). It causes pain in the chest and rib area. Costochondritis usually goes away on its own over time. It can take up to 6 weeks or longer to get better, especially if you are unable to limit your activities. CAUSES  Some cases of costochondritis have no known cause. Possible causes include:  Injury (trauma).  Exercise or activity such as lifting.  Severe coughing. SIGNS AND SYMPTOMS  Pain and tenderness in the chest and rib area.  Pain that gets worse when coughing or taking deep breaths.  Pain that gets worse with specific movements. DIAGNOSIS  Your health care provider will do a physical exam and ask about your symptoms. Chest X-rays or other tests may be done to rule out other problems. TREATMENT  Costochondritis usually goes away on its own over time. Your health care provider may prescribe medicine to help relieve pain. HOME CARE INSTRUCTIONS   Avoid exhausting physical activity. Try not to strain your ribs during normal activity. This would include any activities using chest, abdominal, and side muscles, especially if heavy weights are used.  Apply ice to the affected area for the first 2 days after the pain begins.  Put ice in a plastic bag.  Place a towel between your skin and the bag.  Leave the ice on for 20 minutes, 2-3 times a day.  Only take over-the-counter or prescription medicines as directed by your health care provider. SEEK MEDICAL CARE IF:  You have redness or swelling at the rib joints. These are signs of infection.  Your pain does not go away  despite rest or medicine. SEEK IMMEDIATE MEDICAL CARE IF:   Your pain increases or you are very uncomfortable.  You have shortness of breath or difficulty breathing.  You cough up blood.  You have worse chest pains, sweating, or vomiting.  You have a fever or persistent symptoms for more than 2-3 days.  You have a fever and your symptoms suddenly get worse. MAKE SURE YOU:   Understand these instructions.  Will watch your condition.  Will get help right away if you are not doing well or get worse. Document Released: 05/19/2005 Document Revised: 05/30/2013 Document Reviewed: 03/13/2013 [facility] Patient Information 2015 [place], [place]. This information is not intended to replace advice given to you by your health care provider. Make sure you discuss any questions you have with your health care provider.  Upper Respiratory Infection, Adult An upper respiratory infection (URI) is also sometimes known as the common cold. The upper respiratory tract includes the nose, sinuses, throat, trachea, and bronchi. Bronchi are the airways leading to the lungs. Most people improve within 1 week, but symptoms can last up to 2 weeks. A residual cough may last even longer.  CAUSES Many different viruses can infect the tissues lining the upper respiratory tract. The tissues become irritated and inflamed and often become very moist. Mucus production is also common. A cold is contagious. You can easily spread the virus to others by oral contact. This includes kissing, sharing a glass, coughing, or sneezing. Touching your mouth or nose and then touching a surface, which is then touched by another person, can also spread the  virus. SYMPTOMS  Symptoms typically develop 1 to 3 days after you come in contact with a cold virus. Symptoms vary from person to person. They may include:  Runny nose.  Sneezing.  Nasal congestion.  Sinus irritation.  Sore throat.  Loss of voice  (laryngitis).  Cough.  Fatigue.  Muscle aches.  Loss of appetite.  Headache.  Low-grade fever. DIAGNOSIS  You might diagnose your own cold based on familiar symptoms, since most people get a cold 2 to 3 times a year. Your caregiver can confirm this based on your exam. Most importantly, your caregiver can check that your symptoms are not due to another disease such as strep throat, sinusitis, pneumonia, asthma, or epiglottitis. Blood tests, throat tests, and X-rays are not necessary to diagnose a common cold, but they may sometimes be helpful in excluding other more serious diseases. Your caregiver will decide if any further tests are required. RISKS AND COMPLICATIONS  You may be at risk for a more severe case of the common cold if you smoke cigarettes, have chronic heart disease (such as heart failure) or lung disease (such as asthma), or if you have a weakened immune system. The very young and very old are also at risk for more serious infections. Bacterial sinusitis, middle ear infections, and bacterial pneumonia can complicate the common cold. The common cold can worsen asthma and chronic obstructive pulmonary disease (COPD). Sometimes, these complications can require emergency medical care and may be life-threatening. PREVENTION  The best way to protect against getting a cold is to practice good hygiene. Avoid oral or hand contact with people with cold symptoms. Wash your hands often if contact occurs. There is no clear evidence that vitamin C, vitamin E, echinacea, or exercise reduces the chance of developing a cold. However, it is always recommended to get plenty of rest and practice good nutrition. TREATMENT  Treatment is directed at relieving symptoms. There is no cure. Antibiotics are not effective, because the infection is caused by a virus, not by bacteria. Treatment may include:  Increased fluid intake. Sports drinks offer valuable electrolytes, sugars, and fluids.  Breathing  heated mist or steam (vaporizer or shower).  Eating chicken soup or other clear broths, and maintaining good nutrition.  Getting plenty of rest.  Using gargles or lozenges for comfort.  Controlling fevers with ibuprofen or acetaminophen as directed by your caregiver.  Increasing usage of your inhaler if you have asthma. Zinc gel and zinc lozenges, taken in the first 24 hours of the common cold, can shorten the duration and lessen the severity of symptoms. Pain medicines may help with fever, muscle aches, and throat pain. A variety of non-prescription medicines are available to treat congestion and runny nose. Your caregiver can make recommendations and may suggest nasal or lung inhalers for other symptoms.  HOME CARE INSTRUCTIONS   Only take over-the-counter or prescription medicines for pain, discomfort, or fever as directed by your caregiver.  Use a warm mist humidifier or inhale steam from a shower to increase air moisture. This may keep secretions moist and make it easier to breathe.  Drink enough water and fluids to keep your urine clear or pale yellow.  Rest as needed.  Return to work when your temperature has returned to normal or as your caregiver advises. You may need to stay home longer to avoid infecting others. You can also use a face mask and careful hand washing to prevent spread of the virus. SEEK MEDICAL CARE IF:   After the  first few days, you feel you are getting worse rather than better.  You need your caregiver's advice about medicines to control symptoms.  You develop chills, worsening shortness of breath, or brown or red sputum. These may be signs of pneumonia.  You develop yellow or brown nasal discharge or pain in the face, especially when you bend forward. These may be signs of sinusitis.  You develop a fever, swollen neck glands, pain with swallowing, or white areas in the back of your throat. These may be signs of strep throat. SEEK IMMEDIATE MEDICAL CARE  IF:   You have a fever.  You develop severe or persistent headache, ear pain, sinus pain, or chest pain.  You develop wheezing, a prolonged cough, cough up blood, or have a change in your usual mucus (if you have chronic lung disease).  You develop sore muscles or a stiff neck. Document Released: 02/02/2001 Document Revised: 11/01/2011 Document Reviewed: 11/14/2013 [facility] Patient Information 2015 [place], [place]. This information is not intended to replace advice given to you by your health care provider. Make sure you discuss any questions you have with your health care provider.

## 2014-09-20 NOTE — ED Notes (Addendum)
Pt reports prod cough with yellow sputum, left ribcage pain on inspiration, shortness of breath, bilateral ear pain, since Wednesday.

## 2014-09-20 NOTE — ED Provider Notes (Signed)
CSN: [id number]     Arrival date & time 09/20/14  1607 History   First MD Initiated Contact with Patient 09/20/14 1613     Chief Complaint  Patient presents with  . Cough     (Consider location/radiation/quality/duration/timing/severity/associated sxs/prior Treatment) HPI Comments: 19 year old obese female presenting with productive cough with yellow sputum, left-sided chest pain, congestion and ear fullness 3 days. Patient reports chest pain worse with coughing, sneezing and deep inspiration, located below her left breast. Denies fevers. She has tried taking over-the-counter cough medicine and Tylenol with no relief. States she feels short of breath when she coughs.  Patient is a 19 y.o. female presenting with cough. The history is provided by the patient.  Cough Associated symptoms: chest pain and ear pain     History reviewed. No pertinent past medical history. History reviewed. No pertinent past surgical history. History reviewed. No pertinent family history. History  Substance Use Topics  . Smoking status: Current Every Day Smoker -- 0.50 packs/day for 3 years    Types: Cigarettes  . Smokeless tobacco: Not on file  . Alcohol Use: No   OB History    No data available     Review of Systems  HENT: Positive for congestion and ear pain.   Respiratory: Positive for cough.   Cardiovascular: Positive for chest pain.  All other systems reviewed and are negative.     Allergies  Bactrim and Laxative pills  Home Medications   Prior to Admission medications   Medication Sig Start Date End Date Taking? Authorizing Provider  albuterol (PROVENTIL HFA;VENTOLIN HFA) 108 (90 BASE) MCG/ACT inhaler Inhale 1-2 puffs into the lungs every 4 (four) hours as needed (cough). 09/20/14   Kathrynn Speedobyn M Vadis Slabach, PA-C  chlorpheniramine-HYDROcodone (TUSSIONEX PENNKINETIC ER) 10-8 MG/5ML LQCR Take 5 mLs by mouth every 12 (twelve) hours as needed for cough. 09/20/14   Bard Haupert M Shanetha Bradham, PA-C  guaiFENesin  (MUCINEX) 600 MG 12 hr tablet Take 2 tablets (1,200 mg total) by mouth 2 (two) times daily. 05/08/14   Abigail Harris, PA-C  HYDROcodone-homatropine (HYCODAN) 5-1.5 MG/5ML syrup Take 5 mLs by mouth every 6 (six) hours as needed for cough. 05/08/14   Arthor CaptainAbigail Harris, PA-C  ibuprofen (ADVIL,MOTRIN) 800 MG tablet Take 1 tablet (800 mg total) by mouth 3 (three) times daily. 09/20/14   Ester Mabe M Averie Hornbaker, PA-C  methocarbamol (ROBAXIN) 500 MG tablet Take 1 tablet (500 mg total) by mouth 2 (two) times daily. 08/09/14   Fayrene HelperBowie Tran, PA-C   BP 155/97 mmHg  Pulse 92  Temp(Src) 99.5 F (37.5 C) (Oral)  Resp 18  Ht 5\' 7"  (1.702 m)  Wt 210 lb (95.255 kg)  BMI 32.88 kg/m2  SpO2 98%  LMP 08/30/2014 Physical Exam  Constitutional: She is oriented to person, place, and time. She appears well-developed and well-nourished. No distress.  Obese.  HENT:  Head: Normocephalic and atraumatic.  Right Ear: Tympanic membrane and ear canal normal.  Left Ear: Tympanic membrane and ear canal normal.  Mouth/Throat: Oropharynx is clear and moist.  Nasal congestion, mucosal edema.  Eyes: Conjunctivae and EOM are normal.  Neck: Normal range of motion. Neck supple.  Cardiovascular: Normal rate, regular rhythm and normal heart sounds.   Pulmonary/Chest: Effort normal and breath sounds normal. No respiratory distress.  Pain exacerbated by deep inspiration. Tender to palpation below left breast.  Musculoskeletal: Normal range of motion. She exhibits no edema.  Neurological: She is alert and oriented to person, place, and time. No sensory deficit.  Skin:  Skin is warm and dry.  Psychiatric: She has a normal mood and affect. Her behavior is normal.  Nursing note and vitals reviewed.   ED Course  Procedures (including critical care time) Labs Review Labs Reviewed - No data to display  Imaging Review Dg Chest 2 View  09/20/2014   CLINICAL DATA:  Cough, left-sided chest pain starting Wednesday  EXAM: CHEST  2 VIEW  COMPARISON:   05/07/2014.  FINDINGS: The heart size and mediastinal contours are within normal limits. Both lungs are clear. The visualized skeletal structures are unremarkable.  IMPRESSION: No active cardiopulmonary disease.   Electronically Signed   By: Natasha Mead M.D.   On: 09/20/2014 16:37     EKG Interpretation None      MDM   Final diagnoses:  Costochondritis  Cough  URI (upper respiratory infection)   Patient in no apparent distress. Vital signs stable. Chest pain reproducible with coughing and palpation. Given history of the left-sided chest pain along with productive cough, chest x-ray obtained to rule out pneumonia. Lungs clear. Chest x-ray clear. Patient reports some improvement of her symptoms with ibuprofen. Most likely costochondritis. Treat with ibuprofen. Treat cough with albuterol inhaler and Tussionex. Stable for discharge. Return precautions given. Patient states understanding of treatment care plan and is agreeable.  Addendum- pt cannot have codeine where [place], will prescribe promethazine DM instead.  Kathrynn Speed, PA-C 09/20/14 1658  Kathrynn Speed, PA-C 09/20/14 1704  Glynn Octave, MD 09/21/14 [phone]

## 2015-02-12 DIAGNOSIS — R079 Chest pain, unspecified: Secondary | ICD-10-CM | POA: Diagnosis present

## 2015-02-12 DIAGNOSIS — Z79899 Other long term (current) drug therapy: Secondary | ICD-10-CM | POA: Diagnosis not present

## 2015-02-12 DIAGNOSIS — Z720 Tobacco use: Secondary | ICD-10-CM | POA: Diagnosis not present

## 2015-02-12 DIAGNOSIS — J209 Acute bronchitis, unspecified: Secondary | ICD-10-CM | POA: Diagnosis not present

## 2015-02-12 DIAGNOSIS — M940 Chondrocostal junction syndrome [Tietze]: Secondary | ICD-10-CM | POA: Insufficient documentation

## 2015-02-13 ENCOUNTER — Emergency Department (HOSPITAL_BASED_OUTPATIENT_CLINIC_OR_DEPARTMENT_OTHER)
Admission: EM | Admit: 2015-02-13 | Discharge: 2015-02-13 | Disposition: A | Discharge status: Home / Self Care | Payer: Medicaid Other | Attending: Emergency Medicine | Admitting: Emergency Medicine

## 2015-02-13 ENCOUNTER — Emergency Department (HOSPITAL_BASED_OUTPATIENT_CLINIC_OR_DEPARTMENT_OTHER): Payer: Medicaid Other

## 2015-02-13 ENCOUNTER — Encounter (HOSPITAL_BASED_OUTPATIENT_CLINIC_OR_DEPARTMENT_OTHER): Payer: Self-pay | Admitting: *Deleted

## 2015-02-13 DIAGNOSIS — M940 Chondrocostal junction syndrome [Tietze]: Secondary | ICD-10-CM

## 2015-02-13 DIAGNOSIS — J209 Acute bronchitis, unspecified: Secondary | ICD-10-CM

## 2015-02-13 MED ORDER — IBUPROFEN 800 MG PO TABS: 800.0000 mg | ORAL_TABLET | Freq: Three times a day (TID) | ORAL | Status: AC | PRN

## 2015-02-13 MED ORDER — ALBUTEROL SULFATE HFA 108 (90 BASE) MCG/ACT IN AERS
2.0000 | INHALATION_SPRAY | RESPIRATORY_TRACT | Status: DC | PRN
  Administered 2015-02-13: 2 via RESPIRATORY_TRACT

## 2015-02-13 MED ORDER — NAPROXEN 250 MG PO TABS
ORAL_TABLET | ORAL | Status: AC
  Administered 2015-02-13: 500 mg via ORAL
  Filled 2015-02-13: qty 2

## 2015-02-13 MED ORDER — ALBUTEROL SULFATE HFA 108 (90 BASE) MCG/ACT IN AERS
INHALATION_SPRAY | RESPIRATORY_TRACT | Status: AC
  Administered 2015-02-13: 2 via RESPIRATORY_TRACT
  Filled 2015-02-13: qty 6.7

## 2015-02-13 MED ORDER — NAPROXEN 250 MG PO TABS
500.0000 mg | ORAL_TABLET | Freq: Once | ORAL | Status: AC
  Administered 2015-02-13: 500 mg via ORAL

## 2015-02-13 MED ORDER — ALBUTEROL SULFATE HFA 108 (90 BASE) MCG/ACT IN AERS: 1.0000 | INHALATION_SPRAY | RESPIRATORY_TRACT | Status: AC | PRN

## 2015-02-13 NOTE — ED Notes (Signed)
Pt c/o SOB on exertion with chest pain x3 days; no distress noted

## 2015-02-13 NOTE — ED Provider Notes (Signed)
CSN: [id number]     Arrival date & time 02/12/15  2349 History   First MD Initiated Contact with Patient 02/13/15 0207     Chief Complaint  Patient presents with  . Chest Pain     (Consider location/radiation/quality/duration/timing/severity/associated sxs/prior Treatment) HPI  This is a 19 year old female who complains of a three-day history of chest pain. The chest pain is located in the parasternal regions bilaterally. Pain is worse with palpation, movement, coughing and deep breathing. She is also had shortness of breath for a day. She denies productive cough. She denies fever, chills, nausea, vomiting or diarrhea. She states her chest hurts "really bad". She was seen for similar symptoms in January.  History reviewed. No pertinent past medical history. History reviewed. No pertinent past surgical history. No family history on file. History  Substance Use Topics  . Smoking status: Current Every Day Smoker -- 0.50 packs/day for 3 years    Types: Cigarettes  . Smokeless tobacco: Not on file  . Alcohol Use: No   OB History    No data available     Review of Systems  All other systems reviewed and are negative.   Allergies  Bactrim and Laxative pills  Home Medications   Prior to Admission medications   Medication Sig Start Date End Date Taking? Authorizing Provider  albuterol (PROVENTIL HFA;VENTOLIN HFA) 108 (90 BASE) MCG/ACT inhaler Inhale 1-2 puffs into the lungs every 4 (four) hours as needed (cough). 09/20/14   Kathrynn Speed, PA-C  chlorpheniramine-HYDROcodone (TUSSIONEX PENNKINETIC ER) 10-8 MG/5ML LQCR Take 5 mLs by mouth every 12 (twelve) hours as needed for cough. 09/20/14   Robyn M Hess, PA-C  guaiFENesin (MUCINEX) 600 MG 12 hr tablet Take 2 tablets (1,200 mg total) by mouth 2 (two) times daily. 05/08/14   Arthor Captain, PA-C  ibuprofen (ADVIL,MOTRIN) 800 MG tablet Take 1 tablet (800 mg total) by mouth 3 (three) times daily. 09/20/14   Robyn M Hess, PA-C  methocarbamol  (ROBAXIN) 500 MG tablet Take 1 tablet (500 mg total) by mouth 2 (two) times daily. 08/09/14   Fayrene Helper, PA-C  promethazine-dextromethorphan (PROMETHAZINE-DM) 6.25-15 MG/5ML syrup Take 5 mLs by mouth 4 (four) times daily as needed for cough. 09/20/14   Robyn M Hess, PA-C   BP 136/87 mmHg  Pulse 91  Temp(Src) 98.5 F (36.9 C) (Oral)  Resp 18  Ht  (1.702 m)  Wt 222 lb 8 oz (100.925 kg)  BMI 34.84 kg/m2  SpO2 100%  LMP 01/20/2015   Physical Exam  General: Well-developed, well-nourished female in no acute distress; appearance consistent with age of record HENT: normocephalic; atraumatic Eyes: pupils equal, round and reactive to light; extraocular muscles intact Neck: supple Heart: regular rate and rhythm Chest: Bilateral parasternal rib tenderness Lungs: Decreased air movement bilaterally without wheezing Abdomen: soft; nondistended; nontender; no masses or hepatosplenomegaly; bowel sounds present Extremities: No deformity; full range of motion; pulses normal Neurologic: Awake, alert and oriented; motor function intact in all extremities and symmetric; no facial droop Skin: Warm and dry Psychiatric: Flat affect    ED Course  Procedures (including critical care time)   MDM  Nursing notes and vitals signs, including pulse oximetry, reviewed.  Summary of this visit's results, reviewed by myself:  Imaging Studies: Dg Chest 2 View  02/13/2015   CLINICAL DATA:  Acute onset of shortness of breath and chest tightness. Initial encounter.  EXAM: CHEST  2 VIEW  COMPARISON:  Chest radiograph performed 09/20/2014  FINDINGS: The lungs  are well-aerated and clear. There is no evidence of focal opacification, pleural effusion or pneumothorax.  The heart is normal in size; the mediastinal contour is within normal limits. No acute osseous abnormalities are seen.  IMPRESSION: No acute cardiopulmonary process seen.   Electronically Signed   By: Roanna Raider M.D.   On: 02/13/2015 01:25        Paula Libra, MD 02/13/15 [phone]

## 2015-02-13 NOTE — Discharge Instructions (Signed)

## 2015-04-11 ENCOUNTER — Emergency Department (HOSPITAL_BASED_OUTPATIENT_CLINIC_OR_DEPARTMENT_OTHER)
Admission: EM | Admit: 2015-04-11 | Discharge: 2015-04-11 | Disposition: A | Discharge status: Home / Self Care | Payer: Medicaid Other | Attending: Emergency Medicine | Admitting: Emergency Medicine

## 2015-04-11 ENCOUNTER — Encounter (HOSPITAL_BASED_OUTPATIENT_CLINIC_OR_DEPARTMENT_OTHER): Payer: Self-pay

## 2015-04-11 DIAGNOSIS — K5900 Constipation, unspecified: Secondary | ICD-10-CM | POA: Diagnosis not present

## 2015-04-11 DIAGNOSIS — Z79899 Other long term (current) drug therapy: Secondary | ICD-10-CM | POA: Diagnosis not present

## 2015-04-11 DIAGNOSIS — F410 Panic disorder [episodic paroxysmal anxiety] without agoraphobia: Secondary | ICD-10-CM | POA: Insufficient documentation

## 2015-04-11 DIAGNOSIS — Z720 Tobacco use: Secondary | ICD-10-CM | POA: Diagnosis not present

## 2015-04-11 DIAGNOSIS — Z3202 Encounter for pregnancy test, result negative: Secondary | ICD-10-CM | POA: Diagnosis not present

## 2015-04-11 MED ORDER — DOCUSATE SODIUM 100 MG PO CAPS: 100.0000 mg | ORAL_CAPSULE | Freq: Two times a day (BID) | ORAL | Status: AC

## 2015-04-11 MED ORDER — LIDOCAINE HCL 2 % EX GEL
1.0000 "application " | Freq: Once | CUTANEOUS | Status: AC
  Administered 2015-04-11: 1 via TOPICAL
  Filled 2015-04-11: qty 20

## 2015-04-11 MED ORDER — LORAZEPAM 1 MG PO TABS
1.0000 mg | ORAL_TABLET | Freq: Once | ORAL | Status: AC
  Administered 2015-04-11: 1 mg via ORAL
  Filled 2015-04-11: qty 1
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
## 2015-04-11 NOTE — Discharge Instructions (Signed)
I recommend you take Colace 100 mg twice a day, increase your fiber and water intake. You may take Metamucil which is a fiber supplement. You may also use over-the-counter Fleet enemas as needed for constipation and magnesium citrate. Please stop taking hydrocodone as this is causing you to be constipated.   Constipation Constipation is when a person has fewer than three bowel movements a week, has difficulty having a bowel movement, or has stools that are dry, hard, or larger than normal. As people grow older, constipation is more common. If you try to fix constipation with medicines that make you have a bowel movement (laxatives), the problem may get worse. Long-term laxative use may cause the muscles of the colon to become weak. A low-fiber diet, not taking in enough fluids, and taking certain medicines may make constipation worse.  CAUSES   Certain medicines, such as antidepressants, pain medicine, iron supplements, antacids, and water pills.   Certain diseases, such as diabetes, irritable bowel syndrome (IBS), thyroid disease, or depression.   Not drinking enough water.   Not eating enough fiber-rich foods.   Stress or travel.   Lack of physical activity or exercise.   Ignoring the urge to have a bowel movement.   Using laxatives too much.  SIGNS AND SYMPTOMS   Having fewer than three bowel movements a week.   Straining to have a bowel movement.   Having stools that are hard, dry, or larger than normal.   Feeling full or bloated.   Pain in the lower abdomen.   Not feeling relief after having a bowel movement.  DIAGNOSIS  Your health care provider will take a medical history and perform a physical exam. Further testing may be done for severe constipation. Some tests may include:  A barium enema X-ray to examine your rectum, colon, and, sometimes, your small intestine.   A sigmoidoscopy to examine your lower colon.   A colonoscopy to examine your entire  colon. TREATMENT  Treatment will depend on the severity of your constipation and what is causing it. Some dietary treatments include drinking more fluids and eating more fiber-rich foods. Lifestyle treatments may include regular exercise. If these diet and lifestyle recommendations do not help, your health care provider may recommend taking over-the-counter laxative medicines to help you have bowel movements. Prescription medicines may be prescribed if over-the-counter medicines do not work.  HOME CARE INSTRUCTIONS   Eat foods that have a lot of fiber, such as fruits, vegetables, whole grains, and beans.  Limit foods high in fat and processed sugars, such as french fries, hamburgers, cookies, candies, and soda.   A fiber supplement may be added to your diet if you cannot get enough fiber from foods.   Drink enough fluids to keep your urine clear or pale yellow.   Exercise regularly or as directed by your health care provider.   Go to the restroom when you have the urge to go. Do not hold it.   Only take over-the-counter or prescription medicines as directed by your health care provider. Do not take other medicines for constipation without talking to your health care provider first.  SEEK IMMEDIATE MEDICAL CARE IF:   You have bright red blood in your stool.   Your constipation lasts for more than 4 days or gets worse.   You have abdominal or rectal pain.   You have thin, pencil-like stools.   You have unexplained weight loss. MAKE SURE YOU:   Understand these instructions.  Will watch your  condition.  Will get help right away if you are not doing well or get worse. Document Released: 05/07/2004 Document Revised: 08/14/2013 Document Reviewed: 05/21/2013 [facility] Patient Information 2015 [place], [place]. This information is not intended to replace advice given to you by your health care provider. Make sure you discuss any questions you have with your health care  provider.  Panic Attacks Panic attacks are sudden, short-livedsurges of severe anxiety, fear, or discomfort. They may occur for no reason when you are relaxed, when you are anxious, or when you are sleeping. Panic attacks may occur for a number of reasons:   Healthy people occasionally have panic attacks in extreme, life-threatening situations, such as war or natural disasters. Normal anxiety is a protective mechanism of the body that helps [place] react to danger (fight or flight response).  Panic attacks are often seen with anxiety disorders, such as panic disorder, social anxiety disorder, generalized anxiety disorder, and phobias. Anxiety disorders cause excessive or uncontrollable anxiety. They may interfere with your relationships or other life activities.  Panic attacks are sometimes seen with other mental illnesses, such as depression and posttraumatic stress disorder.  Certain medical conditions, prescription medicines, and drugs of abuse can cause panic attacks. SYMPTOMS  Panic attacks start suddenly, peak within 20 minutes, and are accompanied by four or more of the following symptoms:  Pounding heart or fast heart rate (palpitations).  Sweating.  Trembling or shaking.  Shortness of breath or feeling smothered.  Feeling choked.  Chest pain or discomfort.  Nausea or strange feeling in your stomach.  Dizziness, light-headedness, or feeling like you will faint.  Chills or hot flushes.  Numbness or tingling in your lips or hands and feet.  Feeling that things are not real or feeling that you are not yourself.  Fear of losing control or going crazy.  Fear of dying. Some of these symptoms can mimic serious medical conditions. For example, you may think you are having a heart attack. Although panic attacks can be very scary, they are not life threatening. DIAGNOSIS  Panic attacks are diagnosed through an assessment by your health care provider. Your health care provider will  ask questions about your symptoms, such as where and when they occurred. Your health care provider will also ask about your medical history and use of alcohol and drugs, including prescription medicines. Your health care provider may order blood tests or other studies to rule out a serious medical condition. Your health care provider may refer you to a mental health professional for further evaluation. TREATMENT   Most healthy people who have one or two panic attacks in an extreme, life-threatening situation will not require treatment.  The treatment for panic attacks associated with anxiety disorders or other mental illness typically involves counseling with a mental health professional, medicine, or a combination of both. Your health care provider will help determine what treatment is best for you.  Panic attacks due to physical illness usually go away with treatment of the illness. If prescription medicine is causing panic attacks, talk with your health care provider about stopping the medicine, decreasing the dose, or substituting another medicine.  Panic attacks due to alcohol or drug abuse go away with abstinence. Some adults need professional help in order to stop drinking or using drugs. HOME CARE INSTRUCTIONS   Take all medicines as directed by your health care provider.   Schedule and attend follow-up visits as directed by your health care provider. It is important to keep all your appointments.  SEEK MEDICAL CARE IF:  You are not able to take your medicines as prescribed.  Your symptoms do not improve or get worse. SEEK IMMEDIATE MEDICAL CARE IF:   You experience panic attack symptoms that are different than your usual symptoms.  You have serious thoughts about hurting yourself or others.  You are taking medicine for panic attacks and have a serious side effect. MAKE SURE YOU:  Understand these instructions.  Will watch your condition.  Will get help right away if you are  not doing well or get worse. Document Released: 08/09/2005 Document Revised: 08/14/2013 Document Reviewed: 03/23/2013 [facility] Patient Information 2015 [place], [place]. This information is not intended to replace advice given to you by your health care provider. Make sure you discuss any questions you have with your health care provider. High-Fiber Diet Fiber is found in fruits, vegetables, and grains. A high-fiber diet encourages the addition of more whole grains, legumes, fruits, and vegetables in your diet. The recommended amount of fiber for adult males is 38 g per day. For adult females, it is 25 g per day. Pregnant and lactating women should get 28 g of fiber per day. If you have a digestive or bowel problem, ask your caregiver for advice before adding high-fiber foods to your diet. Eat a variety of high-fiber foods instead of only a select few type of foods.  PURPOSE  To increase stool bulk.  To make bowel movements more regular to prevent constipation.  To lower cholesterol.  To prevent overeating. WHEN IS THIS DIET USED?  It may be used if you have constipation and hemorrhoids.  It may be used if you have uncomplicated diverticulosis (intestine condition) and irritable bowel syndrome.  It may be used if you need help with weight management.  It may be used if you want to add it to your diet as a protective measure against atherosclerosis, diabetes, and cancer. SOURCES OF FIBER  Whole-grain breads and cereals.  Fruits, such as apples, oranges, bananas, berries, prunes, and pears.  Vegetables, such as green peas, carrots, sweet potatoes, beets, broccoli, cabbage, spinach, and artichokes.  Legumes, such split peas, soy, lentils.  Almonds. FIBER CONTENT IN FOODS Starches and Grains / Dietary Fiber (g)  Cheerios, 1 cup / 3 g  Corn Flakes cereal, 1 cup / 0.7 g  Rice crispy treat cereal, 1 cup / 0.3 g  Instant oatmeal (cooked),  cup / 2 g  Frosted wheat cereal, 1 cup  / 5.1 g  Brown, long-grain rice (cooked), 1 cup / 3.5 g  White, long-grain rice (cooked), 1 cup / 0.6 g  Enriched macaroni (cooked), 1 cup / 2.5 g Legumes / Dietary Fiber (g)  Baked beans (canned, plain, or vegetarian),  cup / 5.2 g  Kidney beans (canned),  cup / 6.8 g  Pinto beans (cooked),  cup / 5.5 g Breads and Crackers / Dietary Fiber (g)  Plain or honey graham crackers, 2 squares / 0.7 g  Saltine crackers, 3 squares / 0.3 g  Plain, salted pretzels, 10 pieces / 1.8 g  Whole-wheat bread, 1 slice / 1.9 g  White bread, 1 slice / 0.7 g  Raisin bread, 1 slice / 1.2 g  Plain bagel, 3 oz / 2 g  Flour tortilla, 1 oz / 0.9 g  Corn tortilla, 1 small / 1.5 g  Hamburger or hotdog bun, 1 small / 0.9 g Fruits / Dietary Fiber (g)  Apple with skin, 1 medium / 4.4 g  Sweetened applesauce,  cup / 1.5 g  Banana,  medium / 1.5 g  Grapes, 10 grapes / 0.4 g  Orange, 1 small / 2.3 g  Raisin, 1.5 oz / 1.6 g  Melon, 1 cup / 1.4 g Vegetables / Dietary Fiber (g)  Green beans (canned),  cup / 1.3 g  Carrots (cooked),  cup / 2.3 g  Broccoli (cooked),  cup / 2.8 g  Peas (cooked),  cup / 4.4 g  Mashed potatoes,  cup / 1.6 g  Lettuce, 1 cup / 0.5 g  Corn (canned),  cup / 1.6 g  Tomato,  cup / 1.1 g Document Released: 08/09/2005 Document Revised: 02/08/2012 Document Reviewed: 11/11/2011 [employer] Patient Information 2015 [place], [place]. This information is not intended to replace advice given to you by your health care provider. Make sure you discuss any questions you have with your health care provider.

## 2015-04-11 NOTE — ED Notes (Signed)
Per Ptar pt c/o severe constipation; was seen at [facility] for same, was given IV fluids and ducolax with no relief; pt called ems as soon as she got home

## 2016-10-07 IMAGING — CR DG CHEST 2V
2 series · 2 of 2 positions shown · non-contrast
Comparison: 05/07/2014.

CLINICAL DATA: Cough, left-sided chest pain starting [REDACTED]

EXAM:
CHEST  2 VIEW

[w chest pa]
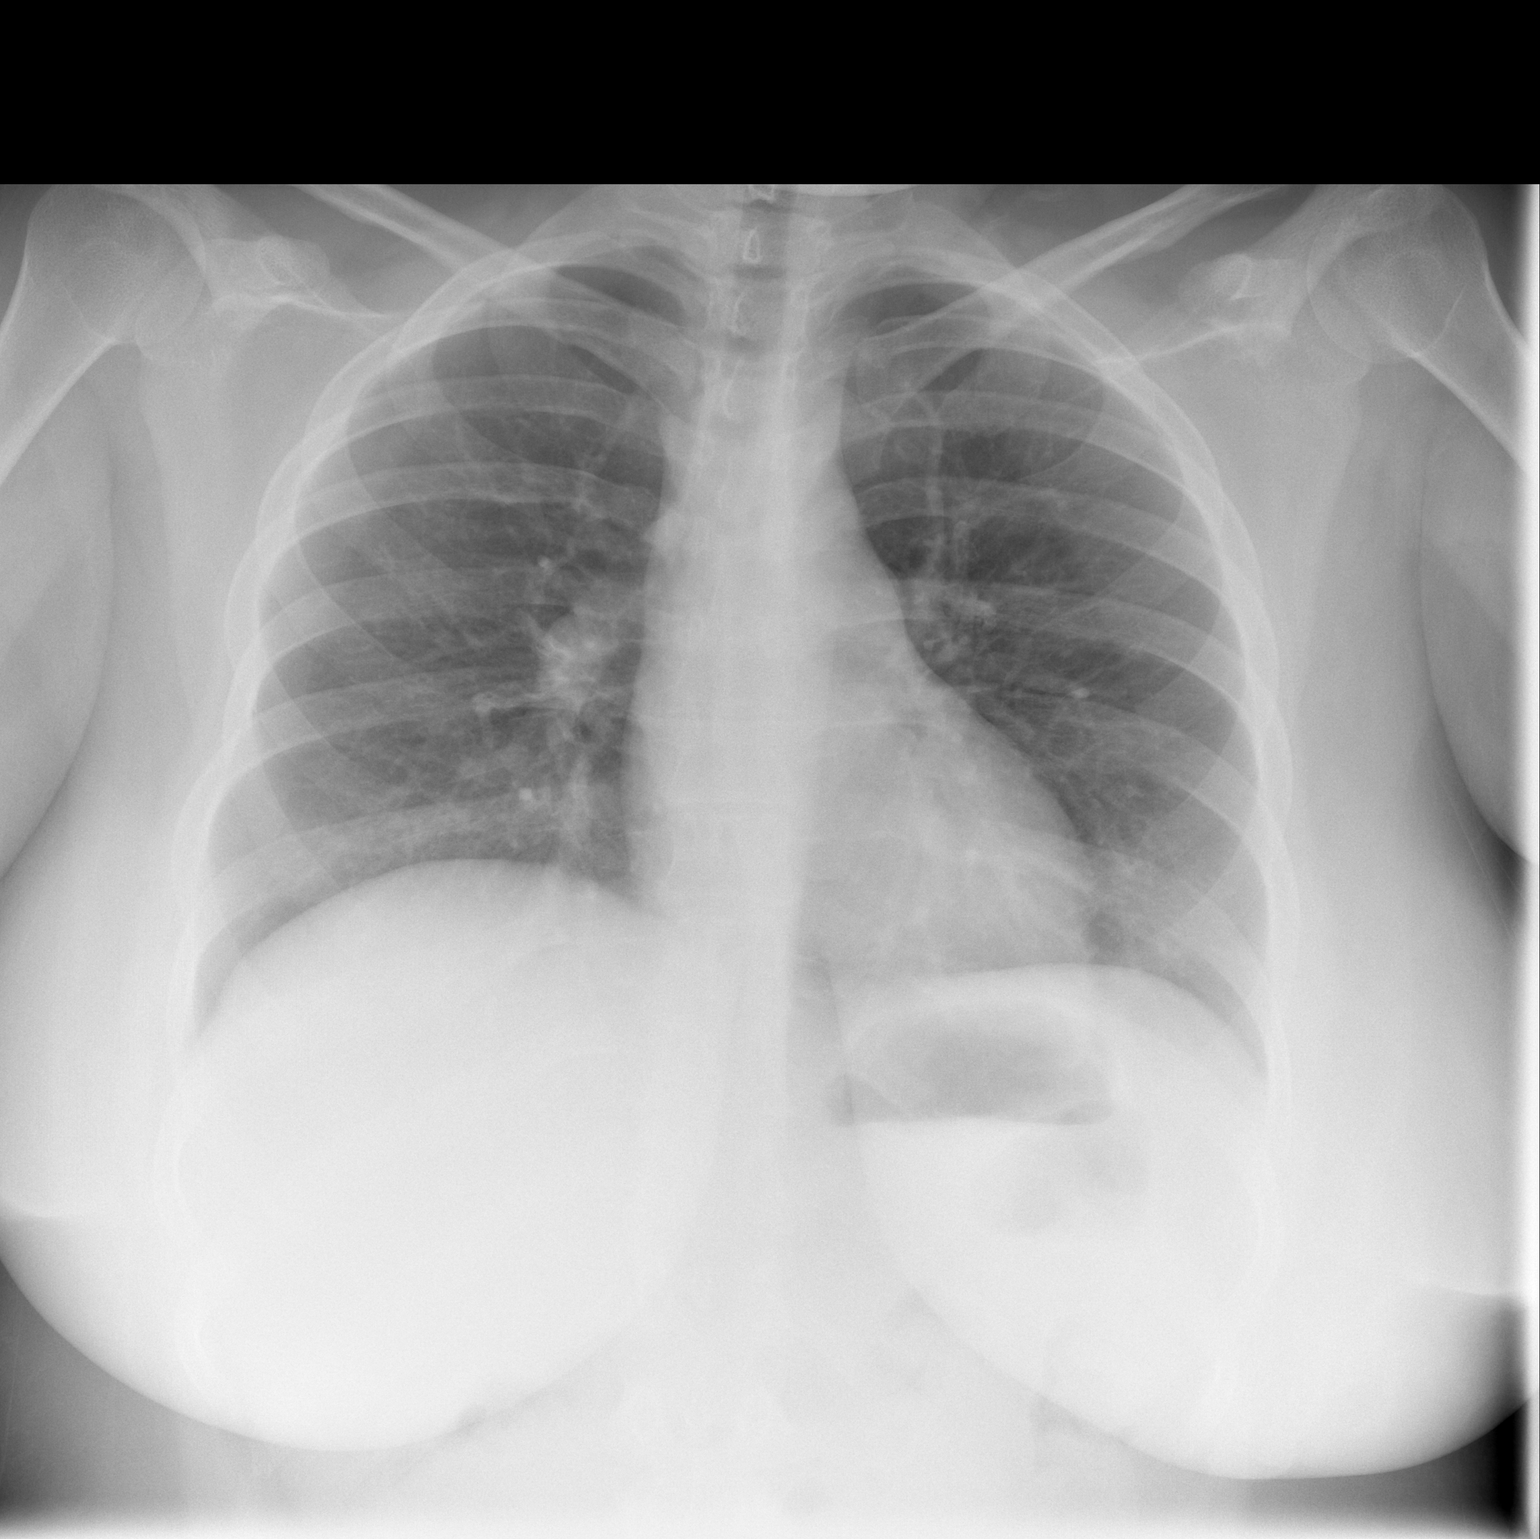

[w chest lat]
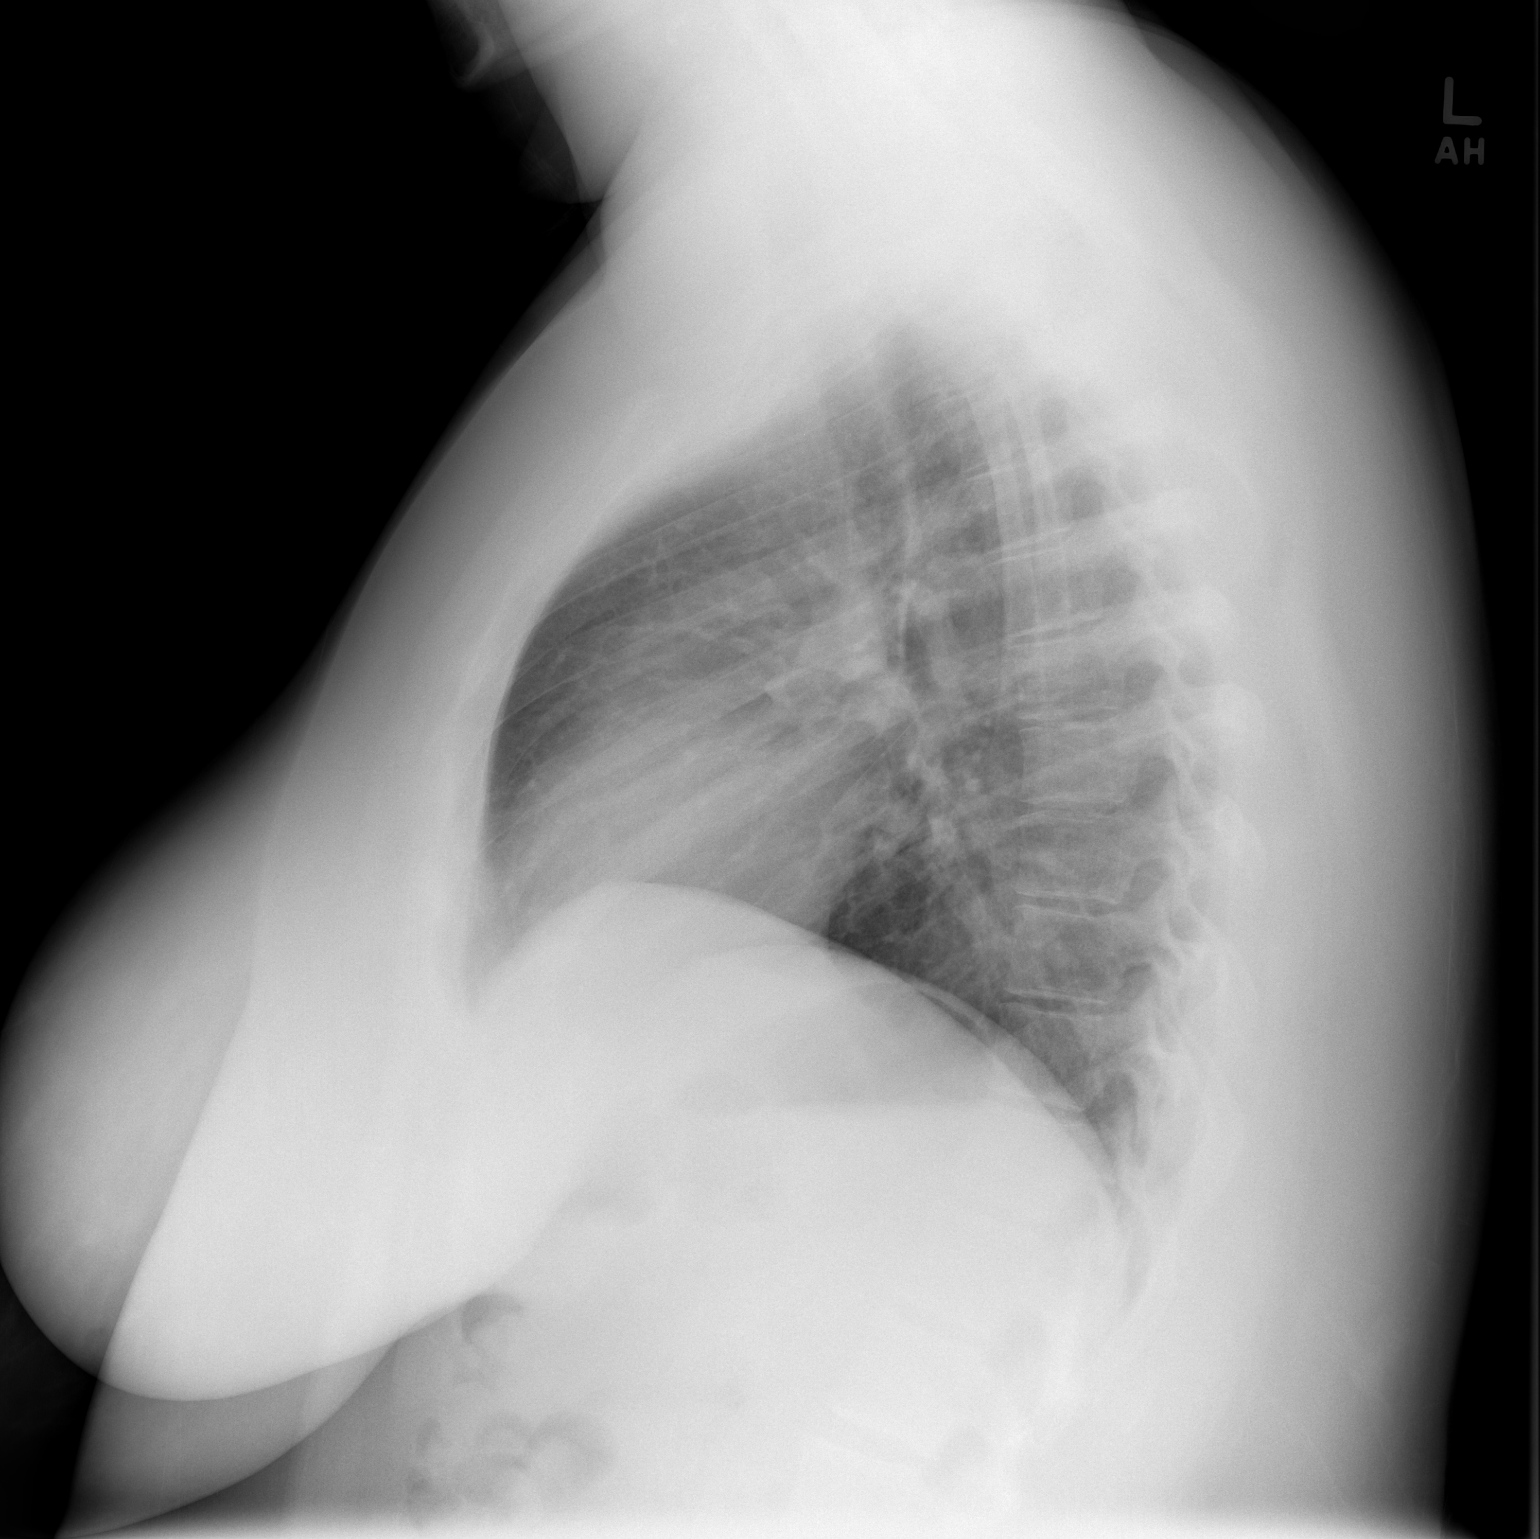

[2 of 2 positions shown; findings below may reference images not displayed]

FINDINGS: The heart size and mediastinal contours are within normal limits.
Both lungs are clear. The visualized skeletal structures are
unremarkable.
IMPRESSION: No active cardiopulmonary disease.

## 2017-03-02 IMAGING — CR DG CHEST 2V
2 series · 2 of 2 positions shown · non-contrast
Comparison: Chest radiograph performed 09/20/2014

CLINICAL DATA: Acute onset of shortness of breath and chest
tightness. Initial encounter.

EXAM:
CHEST  2 VIEW

[w chest pa]
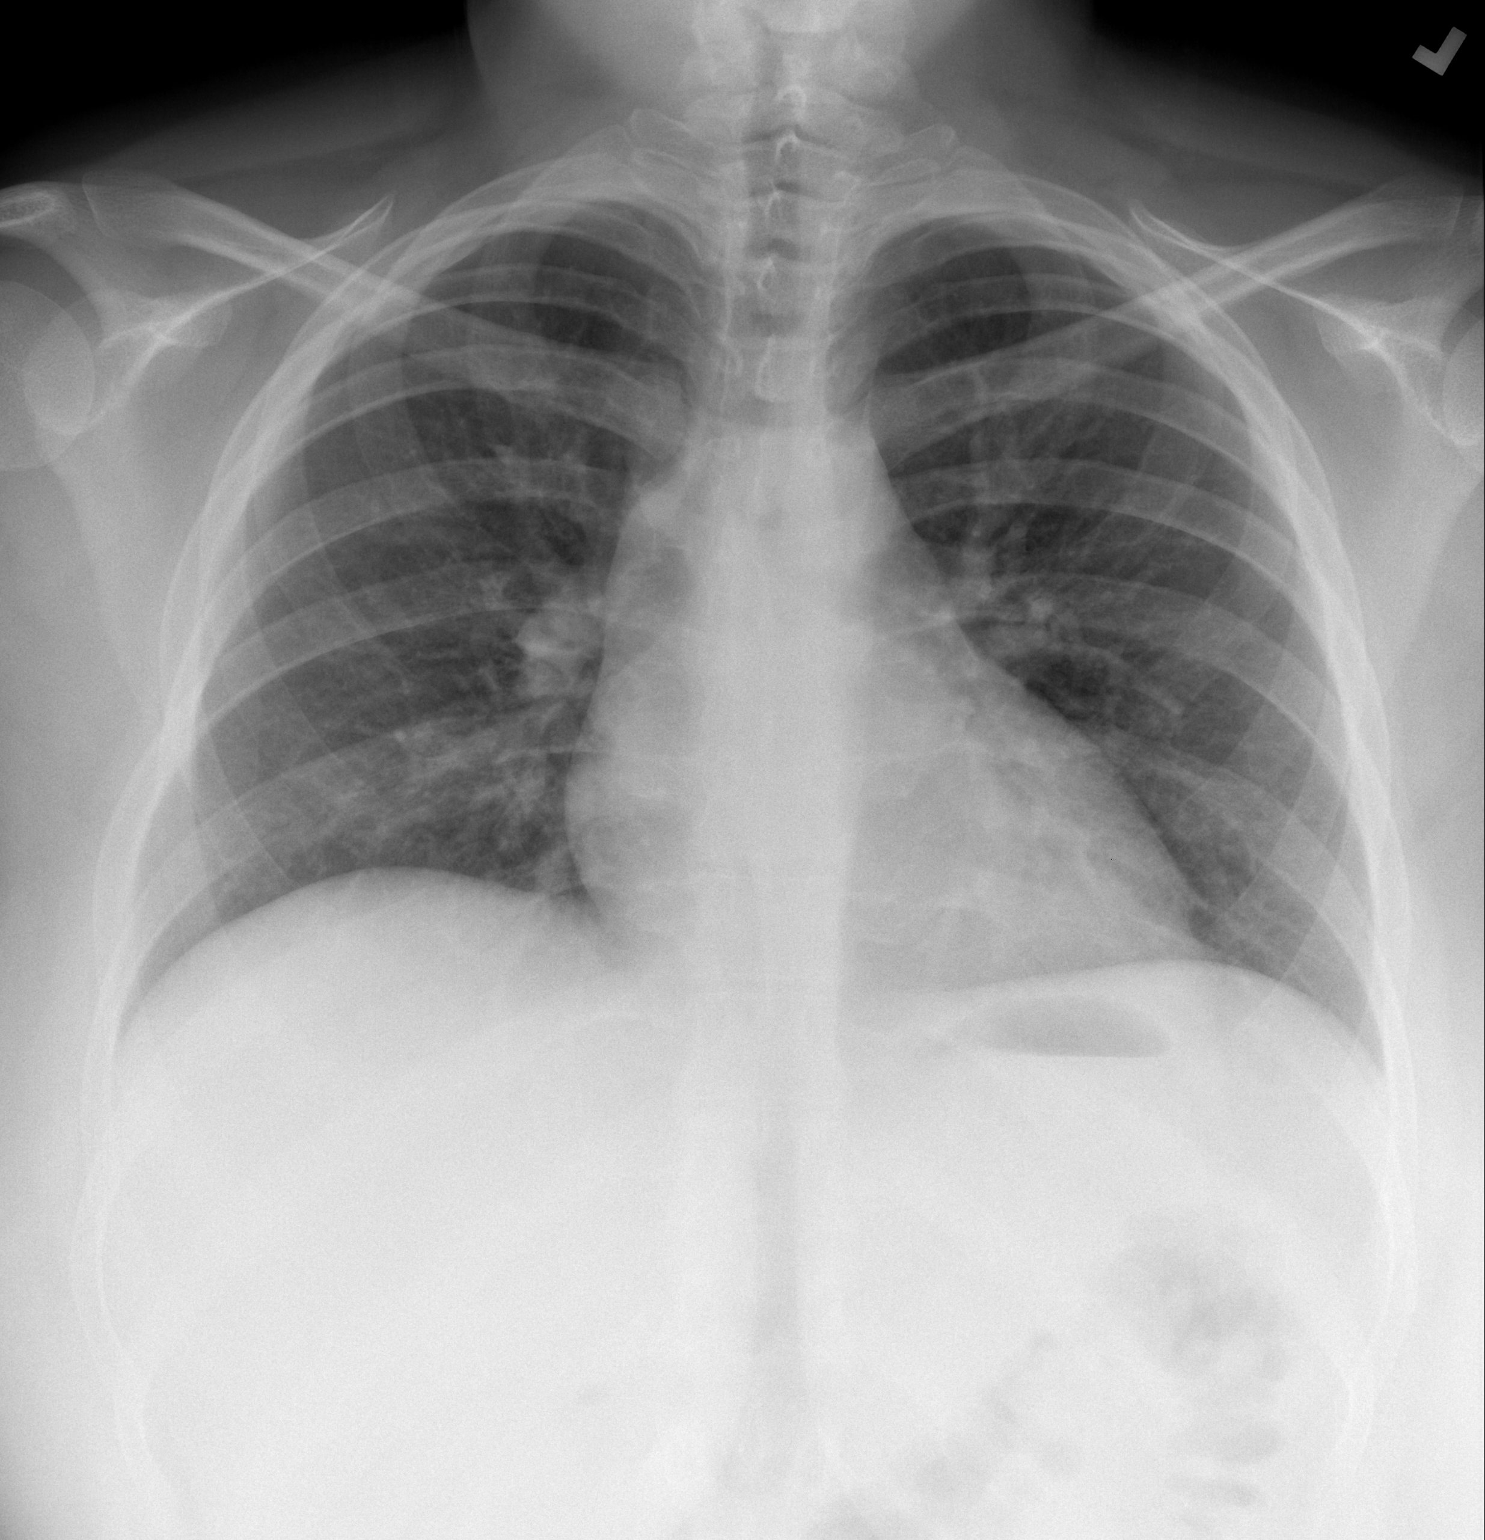

[w chest lat]
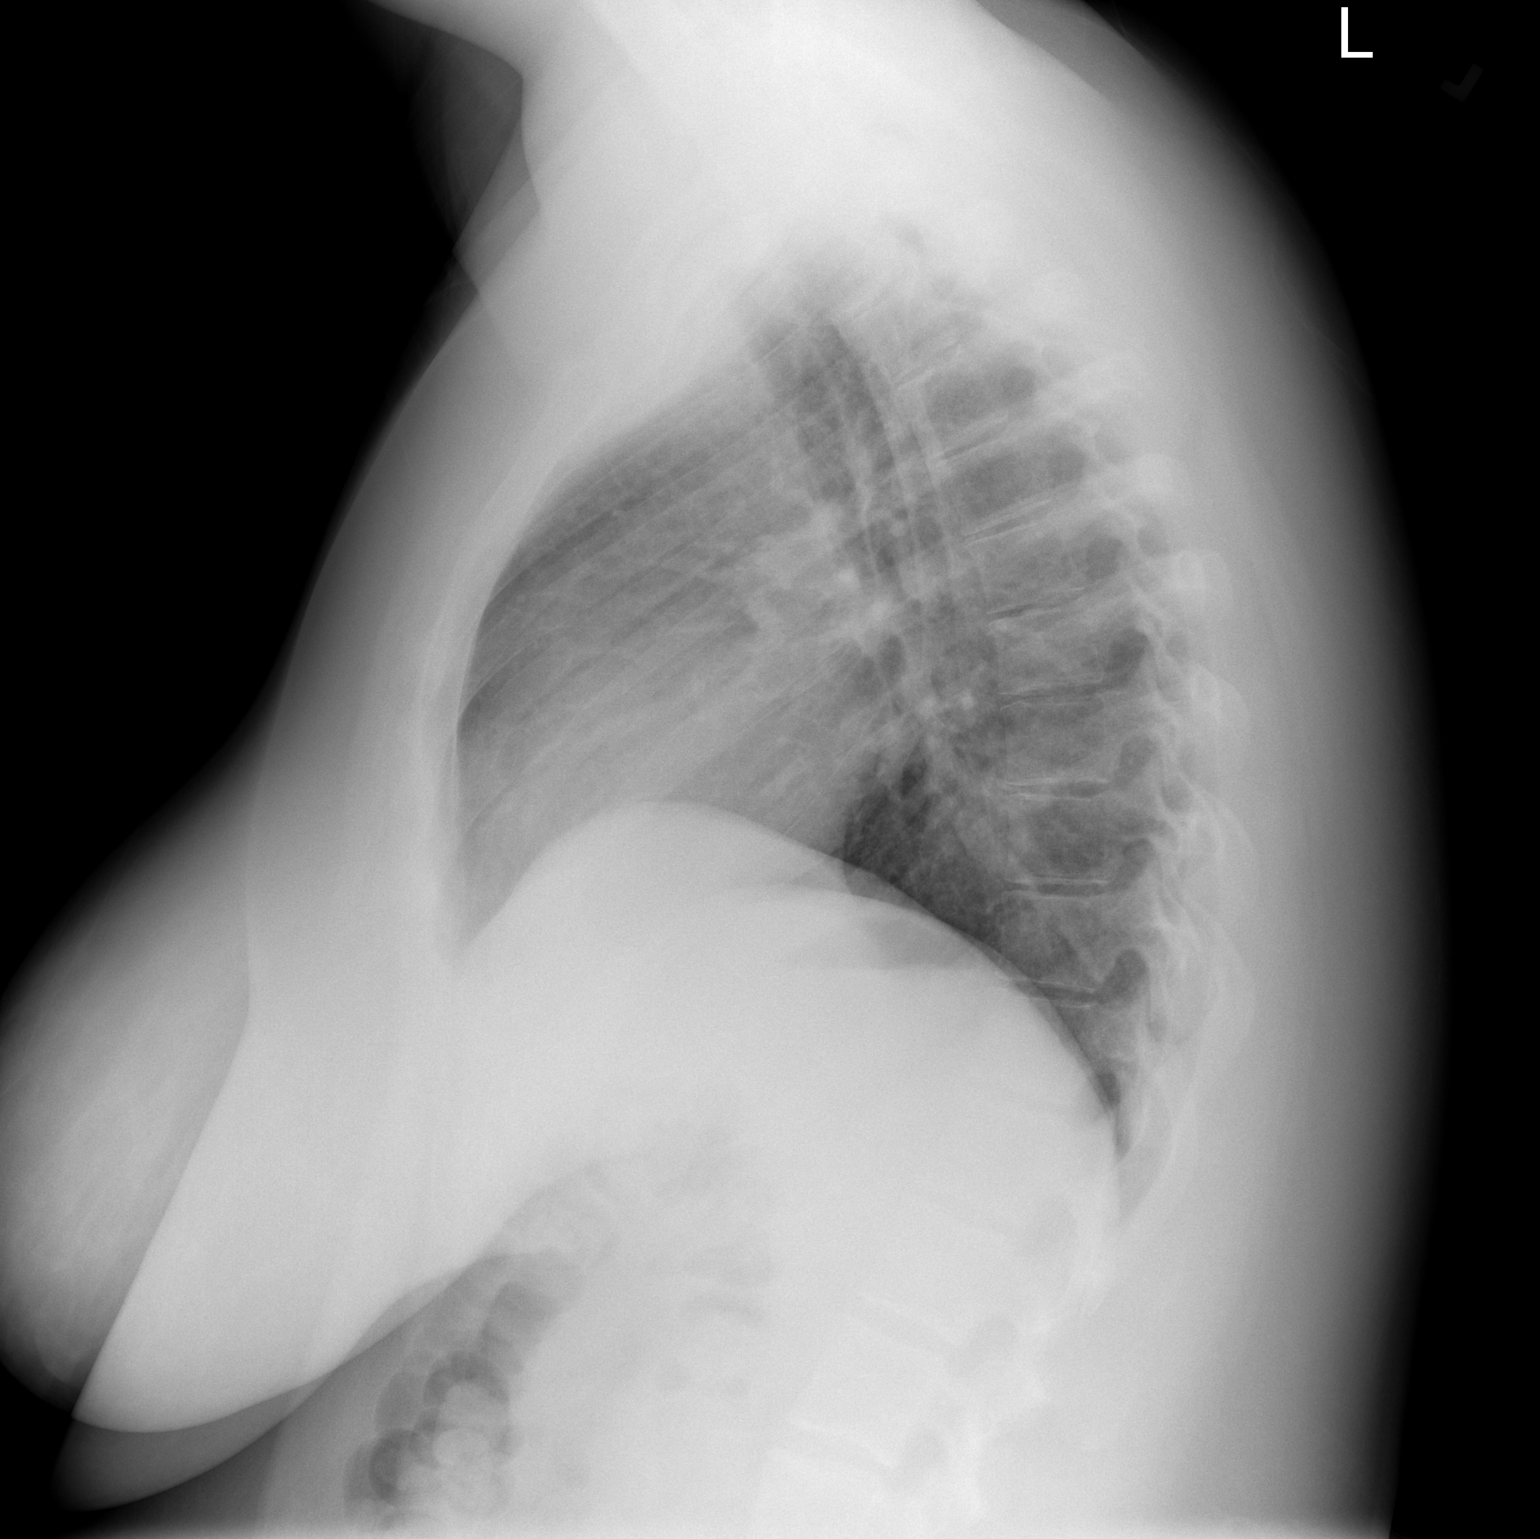

[2 of 2 positions shown; findings below may reference images not displayed]

FINDINGS: The lungs are well-aerated and clear. There is no evidence of focal
opacification, pleural effusion or pneumothorax.

The heart is normal in size; the mediastinal contour is within
normal limits. No acute osseous abnormalities are seen.
IMPRESSION: No acute cardiopulmonary process seen.
